# Patient Record
Sex: Female | Born: 1935 | Race: White | Hispanic: No | Smoking: Never smoker
Health system: Southern US, Community
[De-identification: ages and names within clinical notes are randomized; demographics above are authoritative.]

## PROBLEM LIST (undated history)

## (undated) DIAGNOSIS — D332 Benign neoplasm of brain, unspecified: Secondary | ICD-10-CM

## (undated) DIAGNOSIS — R51 Headache: Secondary | ICD-10-CM

## (undated) DIAGNOSIS — R011 Cardiac murmur, unspecified: Secondary | ICD-10-CM

## (undated) DIAGNOSIS — G20C Parkinsonism, unspecified: Secondary | ICD-10-CM

## (undated) DIAGNOSIS — M1990 Unspecified osteoarthritis, unspecified site: Secondary | ICD-10-CM

## (undated) DIAGNOSIS — R569 Unspecified convulsions: Secondary | ICD-10-CM

## (undated) DIAGNOSIS — F419 Anxiety disorder, unspecified: Secondary | ICD-10-CM

## (undated) DIAGNOSIS — R0602 Shortness of breath: Secondary | ICD-10-CM

## (undated) DIAGNOSIS — H269 Unspecified cataract: Secondary | ICD-10-CM

## (undated) DIAGNOSIS — R413 Other amnesia: Secondary | ICD-10-CM

## (undated) DIAGNOSIS — I499 Cardiac arrhythmia, unspecified: Secondary | ICD-10-CM

## (undated) DIAGNOSIS — I10 Essential (primary) hypertension: Secondary | ICD-10-CM

## (undated) DIAGNOSIS — K219 Gastro-esophageal reflux disease without esophagitis: Secondary | ICD-10-CM

## (undated) DIAGNOSIS — G20 Parkinson's disease: Secondary | ICD-10-CM

## (undated) DIAGNOSIS — E7800 Pure hypercholesterolemia, unspecified: Secondary | ICD-10-CM

## (undated) DIAGNOSIS — Z8719 Personal history of other diseases of the digestive system: Secondary | ICD-10-CM

## (undated) HISTORY — DX: Parkinson's disease: G20

## (undated) HISTORY — DX: Parkinsonism, unspecified: G20.C

## (undated) HISTORY — DX: Cardiac murmur, unspecified: R01.1

## (undated) HISTORY — PX: BUNIONECTOMY: SHX129

## (undated) HISTORY — PX: BREAST EXCISIONAL BIOPSY: SUR124

## (undated) HISTORY — PX: EYE SURGERY: SHX253

## (undated) HISTORY — DX: Pure hypercholesterolemia, unspecified: E78.00

## (undated) HISTORY — PX: ABDOMINAL HYSTERECTOMY: SHX81

## (undated) HISTORY — DX: Unspecified cataract: H26.9

## (undated) HISTORY — DX: Other amnesia: R41.3

---

## 1999-05-01 ENCOUNTER — Ambulatory Visit (HOSPITAL_COMMUNITY): Admission: RE | Admit: 1999-05-01 | Discharge: 1999-05-01 | Payer: Self-pay | Admitting: Internal Medicine

## 1999-05-01 ENCOUNTER — Encounter (INDEPENDENT_AMBULATORY_CARE_PROVIDER_SITE_OTHER): Payer: Self-pay | Admitting: *Deleted

## 1999-05-30 ENCOUNTER — Encounter: Payer: Self-pay | Admitting: Obstetrics and Gynecology

## 1999-06-03 ENCOUNTER — Encounter (INDEPENDENT_AMBULATORY_CARE_PROVIDER_SITE_OTHER): Payer: Self-pay

## 1999-06-03 ENCOUNTER — Inpatient Hospital Stay (HOSPITAL_COMMUNITY): Admission: RE | Admit: 1999-06-03 | Discharge: 1999-06-06 | Payer: Self-pay | Admitting: Obstetrics and Gynecology

## 1999-06-27 ENCOUNTER — Encounter: Admission: RE | Admit: 1999-06-27 | Discharge: 1999-06-27 | Payer: Self-pay | Admitting: Internal Medicine

## 1999-06-27 ENCOUNTER — Encounter: Payer: Self-pay | Admitting: Internal Medicine

## 1999-12-19 ENCOUNTER — Encounter: Admission: RE | Admit: 1999-12-19 | Discharge: 1999-12-19 | Payer: Self-pay | Admitting: Internal Medicine

## 1999-12-19 ENCOUNTER — Encounter: Payer: Self-pay | Admitting: Internal Medicine

## 2000-12-10 ENCOUNTER — Encounter: Payer: Self-pay | Admitting: Internal Medicine

## 2000-12-10 ENCOUNTER — Encounter: Admission: RE | Admit: 2000-12-10 | Discharge: 2000-12-10 | Payer: Self-pay | Admitting: Internal Medicine

## 2001-10-10 ENCOUNTER — Encounter: Admission: RE | Admit: 2001-10-10 | Discharge: 2001-10-10 | Payer: Self-pay | Admitting: Internal Medicine

## 2001-10-10 ENCOUNTER — Encounter: Payer: Self-pay | Admitting: Internal Medicine

## 2001-12-14 ENCOUNTER — Encounter: Admission: RE | Admit: 2001-12-14 | Discharge: 2001-12-14 | Payer: Self-pay | Admitting: Internal Medicine

## 2001-12-14 ENCOUNTER — Encounter: Payer: Self-pay | Admitting: Internal Medicine

## 2002-11-10 ENCOUNTER — Ambulatory Visit (HOSPITAL_COMMUNITY): Admission: RE | Admit: 2002-11-10 | Discharge: 2002-11-10 | Payer: Self-pay | Admitting: Gastroenterology

## 2002-12-19 ENCOUNTER — Encounter: Admission: RE | Admit: 2002-12-19 | Discharge: 2002-12-19 | Payer: Self-pay | Admitting: Internal Medicine

## 2002-12-19 ENCOUNTER — Encounter: Payer: Self-pay | Admitting: Internal Medicine

## 2003-02-26 ENCOUNTER — Ambulatory Visit (HOSPITAL_COMMUNITY): Admission: RE | Admit: 2003-02-26 | Discharge: 2003-02-26 | Payer: Self-pay | Admitting: General Surgery

## 2003-02-26 ENCOUNTER — Encounter (INDEPENDENT_AMBULATORY_CARE_PROVIDER_SITE_OTHER): Payer: Self-pay

## 2003-12-20 ENCOUNTER — Encounter: Admission: RE | Admit: 2003-12-20 | Discharge: 2003-12-20 | Payer: Self-pay | Admitting: Internal Medicine

## 2004-04-20 HISTORY — PX: KNEE SURGERY: SHX244

## 2004-04-30 ENCOUNTER — Encounter: Admission: RE | Admit: 2004-04-30 | Discharge: 2004-04-30 | Payer: Self-pay | Admitting: Orthopedic Surgery

## 2005-01-19 ENCOUNTER — Encounter: Admission: RE | Admit: 2005-01-19 | Discharge: 2005-01-19 | Payer: Self-pay | Admitting: Internal Medicine

## 2006-01-26 ENCOUNTER — Encounter: Admission: RE | Admit: 2006-01-26 | Discharge: 2006-01-26 | Payer: Self-pay | Admitting: Internal Medicine

## 2006-12-08 ENCOUNTER — Ambulatory Visit (HOSPITAL_COMMUNITY): Admission: RE | Admit: 2006-12-08 | Discharge: 2006-12-09 | Payer: Self-pay | Admitting: Orthopedic Surgery

## 2007-02-18 ENCOUNTER — Encounter: Admission: RE | Admit: 2007-02-18 | Discharge: 2007-02-18 | Payer: Self-pay | Admitting: Internal Medicine

## 2007-04-18 ENCOUNTER — Encounter: Admission: RE | Admit: 2007-04-18 | Discharge: 2007-04-18 | Payer: Self-pay | Admitting: Internal Medicine

## 2007-05-06 ENCOUNTER — Encounter: Admission: RE | Admit: 2007-05-06 | Discharge: 2007-05-06 | Payer: Self-pay | Admitting: Internal Medicine

## 2007-05-13 ENCOUNTER — Encounter: Admission: RE | Admit: 2007-05-13 | Discharge: 2007-05-13 | Payer: Self-pay | Admitting: Internal Medicine

## 2008-03-12 ENCOUNTER — Encounter: Admission: RE | Admit: 2008-03-12 | Discharge: 2008-03-12 | Payer: Self-pay | Admitting: Internal Medicine

## 2008-04-20 DIAGNOSIS — D332 Benign neoplasm of brain, unspecified: Secondary | ICD-10-CM

## 2008-04-20 HISTORY — DX: Benign neoplasm of brain, unspecified: D33.2

## 2008-04-20 HISTORY — PX: BRAIN BIOPSY: SHX905

## 2008-06-07 ENCOUNTER — Encounter: Admission: RE | Admit: 2008-06-07 | Discharge: 2008-06-07 | Payer: Self-pay | Admitting: Neurology

## 2008-10-12 ENCOUNTER — Encounter: Admission: RE | Admit: 2008-10-12 | Discharge: 2008-10-12 | Payer: Self-pay | Admitting: Internal Medicine

## 2009-03-04 ENCOUNTER — Encounter: Admission: RE | Admit: 2009-03-04 | Discharge: 2009-03-04 | Payer: Self-pay | Admitting: Neurology

## 2009-06-14 DIAGNOSIS — M810 Age-related osteoporosis without current pathological fracture: Secondary | ICD-10-CM | POA: Insufficient documentation

## 2009-06-14 DIAGNOSIS — F3289 Other specified depressive episodes: Secondary | ICD-10-CM | POA: Insufficient documentation

## 2010-04-30 ENCOUNTER — Encounter
Admission: RE | Admit: 2010-04-30 | Discharge: 2010-04-30 | Payer: Self-pay | Source: Home / Self Care | Attending: Internal Medicine | Admitting: Internal Medicine

## 2010-05-11 ENCOUNTER — Encounter: Payer: Self-pay | Admitting: Internal Medicine

## 2010-06-25 ENCOUNTER — Ambulatory Visit
Admission: RE | Admit: 2010-06-25 | Discharge: 2010-06-25 | Disposition: A | Discharge status: Home / Self Care | Payer: Self-pay | Source: Ambulatory Visit | Attending: Internal Medicine | Admitting: Internal Medicine

## 2010-06-25 ENCOUNTER — Other Ambulatory Visit: Payer: Self-pay | Admitting: Internal Medicine

## 2010-06-25 DIAGNOSIS — M25519 Pain in unspecified shoulder: Secondary | ICD-10-CM

## 2010-07-17 ENCOUNTER — Emergency Department (HOSPITAL_COMMUNITY): Payer: Medicare Other

## 2010-07-17 ENCOUNTER — Emergency Department (HOSPITAL_COMMUNITY)
Admission: EM | Admit: 2010-07-17 | Discharge: 2010-07-18 | Disposition: A | Discharge status: Home / Self Care | Payer: Medicare Other | Attending: Emergency Medicine | Admitting: Emergency Medicine

## 2010-07-17 DIAGNOSIS — I499 Cardiac arrhythmia, unspecified: Secondary | ICD-10-CM | POA: Insufficient documentation

## 2010-07-17 DIAGNOSIS — N390 Urinary tract infection, site not specified: Secondary | ICD-10-CM | POA: Insufficient documentation

## 2010-07-17 DIAGNOSIS — R51 Headache: Secondary | ICD-10-CM | POA: Insufficient documentation

## 2010-07-17 DIAGNOSIS — G40909 Epilepsy, unspecified, not intractable, without status epilepticus: Secondary | ICD-10-CM | POA: Insufficient documentation

## 2010-07-17 DIAGNOSIS — R42 Dizziness and giddiness: Secondary | ICD-10-CM | POA: Insufficient documentation

## 2010-07-17 DIAGNOSIS — M1990 Unspecified osteoarthritis, unspecified site: Secondary | ICD-10-CM | POA: Insufficient documentation

## 2010-07-17 LAB — CBC
HCT: 41.9 % (ref 36.0–46.0)
Hemoglobin: 14.1 g/dL (ref 12.0–15.0)
MCH: 31.9 pg (ref 26.0–34.0)
MCHC: 33.7 g/dL (ref 30.0–36.0)
MCV: 94.8 fL (ref 78.0–100.0)
Platelets: 156 K/uL (ref 150–400)
RBC: 4.42 MIL/uL (ref 3.87–5.11)
RDW: 13.2 % (ref 11.5–15.5)
WBC: 15.7 K/uL — ABNORMAL HIGH (ref 4.0–10.5)

## 2010-07-17 LAB — DIFFERENTIAL
Basophils Absolute: 0 K/uL (ref 0.0–0.1)
Basophils Relative: 0 % (ref 0–1)
Eosinophils Absolute: 0 K/uL (ref 0.0–0.7)
Eosinophils Relative: 0 % (ref 0–5)
Lymphocytes Relative: 4 % — ABNORMAL LOW (ref 12–46)
Lymphs Abs: 0.6 10*3/uL — ABNORMAL LOW (ref 0.7–4.0)
Monocytes Absolute: 1.3 10*3/uL — ABNORMAL HIGH (ref 0.1–1.0)
Monocytes Relative: 9 % (ref 3–12)
Neutro Abs: 13.8 10*3/uL — ABNORMAL HIGH (ref 1.7–7.7)
Neutrophils Relative %: 88 % — ABNORMAL HIGH (ref 43–77)

## 2010-07-17 LAB — URINE MICROSCOPIC-ADD ON

## 2010-07-17 LAB — URINALYSIS, ROUTINE W REFLEX MICROSCOPIC
Bilirubin Urine: NEGATIVE
Glucose, UA: NEGATIVE mg/dL
Ketones, ur: NEGATIVE mg/dL
Nitrite: POSITIVE — AB
Protein, ur: 30 mg/dL — AB
Specific Gravity, Urine: 1.008 (ref 1.005–1.030)
Urobilinogen, UA: 0.2 mg/dL (ref 0.0–1.0)
pH: 7 (ref 5.0–8.0)

## 2010-07-17 LAB — POCT I-STAT, CHEM 8
BUN: 11 mg/dL (ref 6–23)
Creatinine, Ser: 0.7 mg/dL (ref 0.4–1.2)
Glucose, Bld: 137 mg/dL — ABNORMAL HIGH (ref 70–99)
Potassium: 4.1 mEq/L (ref 3.5–5.1)
Sodium: 137 mEq/L (ref 135–145)

## 2010-07-17 LAB — POCT CARDIAC MARKERS: Myoglobin, poc: 60.8 ng/mL (ref 12–200)

## 2010-07-19 LAB — URINE CULTURE
Colony Count: >=100000
Culture  Setup Time: 201203300358

## 2010-09-02 NOTE — Op Note (Signed)
NAMEKRISTINE, Carrie Short                  ACCOUNT NO.:  [id number]   MEDICAL RECORD NO.:  [id number]          PATIENT TYPE:  AMB   LOCATION:  DAY                          FACILITY:  [facility]   PHYSICIAN:  Marlowe Kays, M.D.  DATE OF BIRTH:  07-14-1927   DATE OF PROCEDURE:  12/08/2006  DATE OF DISCHARGE:                               OPERATIVE REPORT   PREOPERATIVE DIAGNOSES:  1. Osteoarthritis.  2. Torn medial meniscus.  3. Loose body in anterior central joint, left knee.   POSTOPERATIVE DIAGNOSES:  1. Osteoarthritis.  2. Torn medial meniscus.  3. Loose body in anterior central joint, left knee.  4. Torn lateral meniscus.  5. Medial shelf plica.   OPERATION:  1. Left knee arthroscopy with one partial medial lateral meniscectomy.  2. Removal of large loose body anterior central knee.  3. Shaving medial femoral condyle.  4. Excision medial shelf plica.   SURGEON:  Marlowe Kays, M.D.   ASSISTANT:  Nurse.   ANESTHESIA:  General.   PATHOLOGY AND JUSTIFICATION FOR PROCEDURE:  Because of painful left knee  with difficulty, walking because it was unstable, I obtained an MRI  which demonstrated the preoperative diagnoses.  Accordingly she is here  today for the above-mentioned surgery.   PROCEDURE:  Satisfactory general anesthesia, time-out performed, an Ace  wrap and knee support to right lower extremity.  Pneumatic tourniquet  with the leg Esmarched out non sterilely to the left lower extremity,  thigh stabilizer applied.  The left leg was prepped with DuraPrep from  stabilizer to ankle, draped in sterile field.  Superior medial saline  inflow, first through an anterolateral portal, medial compartment joint  was evaluated.  Immediately apparent was a large loose fragment which  was wrapped into the medial portion of the ACL with a portion of the ACL  disrupted.  Was also was a little bit of surrounding synovitis.  With  the 3.5 shaver.  I resected most of reactive tissue which  exposed the  loose body and I was able to bring it out piecemeal with pituitary  rongeur.  As part of this I had to sacrifice some of the medial portion  of the ACL which was significantly disrupted.  Her medial femoral  condyle had a good bit of wear.  I would  rate it grade 3 out of 4 and I  shaved this down until smooth.  The entire posterior third of the medial  meniscus was torn.  Representative pictures were taken.  I then trimmed  this back to stable rim with a combination of baskets and a 3.5 shaver.  The final pictures were taken for the stable remnant.  Looking at the  medial gutter and suprapatellar area, she had a medial shelf plica which  I sectioned with scissors and evacuated with the 3.5 shaver.  Her  patella had wear but nothing shavable.  I then reversed portals.  She  had a large bucket-handle tear of the anterior half off the lateral  meniscus which I sectioned with scissors anteriorly and posteriorly and  then removed the torn portion  of lateral meniscus which actually  encompassed almost the entire lateral meniscus at the anterior half.  Final pictures were taken.  Knee joint was irrigated until clear and all  fluid possible, removed.  The two anterior portals closed with 4-0  nylon.  I injected 20 mL 0.5% Marcaine with adrenalin and 4 mg of  morphine through the inflow apparatus which was removed and this portal  closed with 4-0 nylon as well.  Betadine, Adaptic dry sterile dressing  were applied.  Tourniquet was released.  She tolerated the procedure  well, was taken recovery satisfactory condition with no known  complications.           ______________________________  Marlowe Kays, M.D.     JA/MEDQ  D:  12/08/2006  T:  12/09/2006  Job:  [id number]

## 2010-09-05 NOTE — Op Note (Signed)
   NAME:  Carrie Short, Carrie Short                            ACCOUNT NO.:  [id number]   MEDICAL RECORD NO.:  [id number]                   PATIENT TYPE:  AMB   LOCATION:  ENDO                                 FACILITY:  [facility]   PHYSICIAN:  Graylin Shiver, M.D.                DATE OF BIRTH:  12-31-1927   DATE OF PROCEDURE:  11/10/2002  DATE OF DISCHARGE:                                 OPERATIVE REPORT   PROCEDURE:  Colonoscopy.   INDICATION FOR PROCEDURE:  Rectal bleeding, family history of colon cancer.   INFORMED CONSENT:  Informed consent was obtained after explanation of the  risks of bleeding, infection, and perforation.   PREMEDICATIONS:  The procedure was done immediately after an EGD.  An  additional 12.5 mcg of fentanyl were given as well as an additional 1 mg of  Versed.   DESCRIPTION OF PROCEDURE:  After informed consent had been obtained after  explanation of the risks of bleeding, infection, and perforation and after  sedation was achieved with the above-mentioned medications, a rectal exam  was performed.  No external lesions were seen.  There was felt on digital  rectal exam to be a nodule in the anal area and just inside the rectum on  the anterior side.  The Olympus colonoscope was inserted into the rectum and  advanced around the colon to the cecum.  Cecal landmarks were identified.  The cecum and ascending colon were normal.  The transverse colon was normal.  The descending colon, sigmoid, and rectum looked normal.  The scope was  retroflexed in the rectum, and a 1 cm anal polyp could be seen.  This  appeared to have skin on the mucosa and was seen on retroflexion.  The scope  was then straightened and brought out, and the polyp in the anal area could  be seen as the scope was brought out.  She tolerated this procedure well  without complications.   IMPRESSION:  Normal colonic mucosa from rectum to cecum with presence of a 1  cm anal polyp which protruded into the rectum  and was seen on retroflexion.   PLAN:  I will make a surgical referral for further evaluation and  consideration of excision of this large anal polyp.                                               Graylin Shiver, M.D.    Carrie Short  D:  11/10/2002  T:  11/10/2002  Job:  [id number]   cc:   Antony Madura, M.D.  [address]  [place]  [place] [address]  Fax: [phone]

## 2010-09-05 NOTE — Op Note (Signed)
   NAME:  Carrie Short, Carrie Short                            ACCOUNT NO.:  [id number]   MEDICAL RECORD NO.:  [id number]                   PATIENT TYPE:  AMB   LOCATION:  ENDO                                 FACILITY:  [facility]   PHYSICIAN:  Graylin Shiver, M.D.                DATE OF BIRTH:  [birth year]   DATE OF PROCEDURE:  11/10/2002  DATE OF DISCHARGE:                                 OPERATIVE REPORT   PROCEDURE:  Esophagogastroduodenoscopy.   INDICATION FOR PROCEDURE:  Chronic heartburn and indigestion.   INFORMED CONSENT:  Informed consent was obtained after explanation of the  risks of bleeding, infection, and perforation.   PREMEDICATIONS:  1. Fentanyl 25 mcg IV.  2. Versed 3 mg IV.   DESCRIPTION OF PROCEDURE:  With the patient in the left lateral decubitus  position, the Olympus gastroscope was inserted into the oropharynx and  passed into the esophagus.  It was advanced down the esophagus, then into  the stomach and into the duodenum.  The second portion and bulb of the  duodenum were normal.  The stomach had normal-appearing mucosa.  There was a  moderate-sized hiatal hernia.  No abnormalities were seen in the fundus or  cardia upon retroflexion other than the hiatal hernia.  The esophagus  revealed a normal esophagogastric junction.  It was located at 35 cm.  The  esophageal mucosa looked normal.  She tolerated the procedure well without  complications.   IMPRESSION:  1. Hiatal hernia.  2. Normal-appearing mucosa on EGD.   COMMENTS:  I suspect that the patient's heartburn is secondary to  gastroesophageal reflux related to her hiatal hernia, and she would probably  benefit from the use of a proton pump inhibitor or H2 blocker.  She has  taken Prilosec in the past which has helped.  I will instruct her to get  over-the-counter Prilosec to try and control her symptoms of heartburn.                                               Graylin Shiver, M.D.    Germain Osgood  D:  11/10/2002   T:  11/10/2002  Job:  [id number]   cc:   Antony Madura, M.D.  [address]  [place]  [place] [address]  Fax: [phone]

## 2010-09-05 NOTE — Op Note (Signed)
   Carrie Short, Carrie Short                              ACCOUNT NO.:  [id number]   MEDICAL RECORD NO.:  [id number]                   PATIENT TYPE:   LOCATION:                                       FACILITY:  [facility]   PHYSICIAN:  Timothy E. Earlene Plater, M.D.              DATE OF BIRTH:   DATE OF PROCEDURE:  02/26/2003  DATE OF DISCHARGE:                                 OPERATIVE REPORT   PREOPERATIVE DIAGNOSIS:  Anal polyp.   POSTOPERATIVE DIAGNOSIS:  Anal polyp.   PROCEDURE:  Removal of hemorrhoid and anal polyp.   SURGEON:  Timothy E. Earlene Plater, M.D.   ANESTHESIA:  Local standby.   Ms. Kanzler recently underwent upper and lower endoscopy by Graylin Shiver,  M.D.  Though colonoscopy was normal, she did have a large prolapsing anal  polyp.  After careful discussion in the office, she wishes to proceed with  excision.  She was prepared at home, identified, the permit signed,  evaluated by anesthesia.   She was taken to the operating room and placed supine, IV antibiotics given.  She was gently placed into the lithotomy position, the perianal area  inspected, prepped and draped.  The left anterior polyp easily prolapsed on  its own.  The area was injected with Marcaine 4% with epinephrine and  massaged in well.  The polyp was left anterior.  It was attached to a  generous bundle of hemorrhoidal varicosities.  The entire bundle with the  attached polyp was prolapsed and then completely excised with undermining of  the edges and careful avoidance of the sphincters.  The resulting wound was  then closed with a running 2-0 chromic with good control of bleeding and  closure of the  mucosal defect.  She tolerated it well.  There were no complications.  Gelfoam and a dry dressing applied.  Counts correct.  She tolerated it well  and was removed to the recovery room in good condition.   Written and verbal instructions were given to her and her family, and she  will be followed as an outpatient.                                           Timothy E. Earlene Plater, M.D.    TED/MEDQ  D:  02/26/2003  T:  02/26/2003  Job:  [id number]   cc:   Graylin Shiver, M.D.  [address]  [place], [place] [address]  Fax: [phone]   Antony Madura, M.D.  [address]  [place]  [place] [address]  Fax: [phone]

## 2010-11-22 DIAGNOSIS — K219 Gastro-esophageal reflux disease without esophagitis: Secondary | ICD-10-CM | POA: Insufficient documentation

## 2011-01-30 LAB — APTT: aPTT: 30

## 2011-01-30 LAB — BASIC METABOLIC PANEL
CO2: 29
Chloride: 110
GFR calc Af Amer: >60
Potassium: 5.1
Sodium: 144

## 2011-01-30 LAB — PROTIME-INR: Prothrombin Time: 13.3

## 2011-01-30 LAB — HEMOGLOBIN AND HEMATOCRIT, BLOOD
HCT: 37
Hemoglobin: 12.7

## 2011-05-20 ENCOUNTER — Other Ambulatory Visit: Payer: Self-pay | Admitting: Internal Medicine

## 2011-05-20 DIAGNOSIS — Z1231 Encounter for screening mammogram for malignant neoplasm of breast: Secondary | ICD-10-CM

## 2011-06-03 ENCOUNTER — Ambulatory Visit
Admission: RE | Admit: 2011-06-03 | Discharge: 2011-06-03 | Disposition: A | Discharge status: Home / Self Care | Payer: Medicare Other | Source: Ambulatory Visit | Attending: Internal Medicine | Admitting: Internal Medicine

## 2011-06-03 DIAGNOSIS — Z1231 Encounter for screening mammogram for malignant neoplasm of breast: Secondary | ICD-10-CM

## 2011-07-22 ENCOUNTER — Ambulatory Visit
Admission: RE | Admit: 2011-07-22 | Discharge: 2011-07-22 | Disposition: A | Discharge status: Home / Self Care | Payer: Medicare Other | Source: Ambulatory Visit | Attending: Internal Medicine | Admitting: Internal Medicine

## 2011-07-22 ENCOUNTER — Other Ambulatory Visit: Payer: Self-pay | Admitting: Internal Medicine

## 2011-07-22 DIAGNOSIS — R0602 Shortness of breath: Secondary | ICD-10-CM

## 2011-07-22 DIAGNOSIS — R05 Cough: Secondary | ICD-10-CM

## 2012-04-06 ENCOUNTER — Other Ambulatory Visit: Payer: Self-pay | Admitting: Internal Medicine

## 2012-04-06 DIAGNOSIS — K30 Functional dyspepsia: Secondary | ICD-10-CM

## 2012-04-18 ENCOUNTER — Ambulatory Visit
Admission: RE | Admit: 2012-04-18 | Discharge: 2012-04-18 | Disposition: A | Discharge status: Home / Self Care | Payer: Medicare Other | Source: Ambulatory Visit | Attending: Internal Medicine | Admitting: Internal Medicine

## 2012-04-18 DIAGNOSIS — K30 Functional dyspepsia: Secondary | ICD-10-CM

## 2012-06-22 ENCOUNTER — Other Ambulatory Visit: Payer: Self-pay | Admitting: Neurology

## 2012-06-29 ENCOUNTER — Ambulatory Visit
Admission: RE | Admit: 2012-06-29 | Discharge: 2012-06-29 | Disposition: A | Discharge status: Home / Self Care | Payer: Medicare Other | Source: Ambulatory Visit | Attending: Neurology | Admitting: Neurology

## 2012-06-29 DIAGNOSIS — R9409 Abnormal results of other function studies of central nervous system: Secondary | ICD-10-CM

## 2012-06-29 MED ORDER — GADOBENATE DIMEGLUMINE 529 MG/ML IV SOLN
15.0000 mL | Freq: Once | INTRAVENOUS | Status: AC | PRN
  Administered 2012-06-29: 15 mL via INTRAVENOUS

## 2012-08-02 ENCOUNTER — Telehealth: Payer: Self-pay

## 2012-08-02 MED ORDER — VENLAFAXINE HCL ER 75 MG PO CP24: 75.0000 mg | ORAL_CAPSULE | Freq: Every day | ORAL | Status: DC

## 2012-08-02 NOTE — Telephone Encounter (Signed)
[facility] patient calling to get refills on Effexor.  Approved through [facility] this morning, Dr Frances Furbish.

## 2012-09-27 ENCOUNTER — Other Ambulatory Visit: Payer: Self-pay

## 2012-10-06 ENCOUNTER — Telehealth: Payer: Self-pay | Admitting: Diagnostic Neuroimaging

## 2012-10-06 NOTE — Telephone Encounter (Signed)
Calling to cancel/reschedule patient - 12/26/2012

## 2012-10-12 ENCOUNTER — Other Ambulatory Visit: Payer: Self-pay | Admitting: Internal Medicine

## 2012-10-12 DIAGNOSIS — N6452 Nipple discharge: Secondary | ICD-10-CM

## 2012-10-19 ENCOUNTER — Other Ambulatory Visit: Payer: Self-pay

## 2012-10-19 MED ORDER — ROPINIROLE HCL 0.5 MG PO TABS: 0.5000 mg | ORAL_TABLET | Freq: Every day | ORAL | Status: DC

## 2012-10-19 NOTE — Telephone Encounter (Signed)
[facility] patient assigned to Dr Marjory Lies.  Has appt in Sept.  Pharmacy is requesting 90 day Rx.

## 2012-10-20 ENCOUNTER — Ambulatory Visit
Admission: RE | Admit: 2012-10-20 | Discharge: 2012-10-20 | Disposition: A | Discharge status: Home / Self Care | Payer: Medicare Other | Source: Ambulatory Visit | Attending: Internal Medicine | Admitting: Internal Medicine

## 2012-10-20 ENCOUNTER — Other Ambulatory Visit: Payer: Self-pay | Admitting: Internal Medicine

## 2012-10-20 ENCOUNTER — Telehealth: Payer: Self-pay | Admitting: Diagnostic Neuroimaging

## 2012-10-20 DIAGNOSIS — N6310 Unspecified lump in the right breast, unspecified quadrant: Secondary | ICD-10-CM

## 2012-10-20 DIAGNOSIS — N6452 Nipple discharge: Secondary | ICD-10-CM

## 2012-10-24 ENCOUNTER — Telehealth: Payer: Self-pay | Admitting: *Deleted

## 2012-10-24 NOTE — Telephone Encounter (Signed)
Pt called and stated someone had called her regarding her appt on 12/26/12, but according to schedule patients appointment was cancelled with Dr. Marjory Lies on 12/26/12

## 2012-11-03 ENCOUNTER — Other Ambulatory Visit: Payer: Self-pay | Admitting: Internal Medicine

## 2012-11-03 ENCOUNTER — Ambulatory Visit
Admission: RE | Admit: 2012-11-03 | Discharge: 2012-11-03 | Disposition: A | Discharge status: Home / Self Care | Payer: Medicare Other | Source: Ambulatory Visit | Attending: Internal Medicine | Admitting: Internal Medicine

## 2012-11-03 DIAGNOSIS — N6310 Unspecified lump in the right breast, unspecified quadrant: Secondary | ICD-10-CM

## 2012-11-18 ENCOUNTER — Encounter (INDEPENDENT_AMBULATORY_CARE_PROVIDER_SITE_OTHER): Payer: Self-pay | Admitting: General Surgery

## 2012-11-18 ENCOUNTER — Ambulatory Visit (INDEPENDENT_AMBULATORY_CARE_PROVIDER_SITE_OTHER): Payer: Medicare Other | Admitting: General Surgery

## 2012-11-18 VITALS — BP 130/72 | HR 74 | Temp 98.0°F | Resp 18 | Ht 63.0 in | Wt 167.0 lb

## 2012-11-18 DIAGNOSIS — N63 Unspecified lump in unspecified breast: Secondary | ICD-10-CM

## 2012-11-18 DIAGNOSIS — N6310 Unspecified lump in the right breast, unspecified quadrant: Secondary | ICD-10-CM

## 2012-11-18 NOTE — Progress Notes (Signed)
Patient ID: Carrie Short, female   DOB: 08/23/1927, 79 y.o.   MRN: [id number]  Chief Complaint  Patient presents with  . New Evaluation    rt breast     HPI Carrie Short is a 79 y.o. female.  Referred by Dr Roberts HPI 84 yof with a couple of episodes of right nipple discharge one of which appears to be spontaneous and the other expressed.  She reports no other complaints referable to her breasts.  She underwent mm that shows no real abnormality.  An us shows an intraductal mass at 2 o'clock 2 cm from the right nipple measuring 4x8x3 mm.  Core biopsy was obtained that showed fragments of intraductal papilloma. She comes in today with no more discharge to discuss her options.  No past medical history on file. History of brain biopsy now being followed by mri benign, htn  Past Surgical History  Procedure Laterality Date  . Abdominal hysterectomy  1990s  . Brain biopsy  2010  . Bunionectomy      left foot    Family History  Problem Relation Age of Onset  . Heart disease Mother   . Cancer Daughter     breast mets to lung  . Diabetes Son     Social History History  Substance Use Topics  . Smoking status: Never Smoker   . Smokeless tobacco: Not on file  . Alcohol Use: Not on file    No Known Allergies  Current Outpatient Prescriptions  Medication Sig Dispense Refill  . celecoxib (CELEBREX) 200 MG capsule Take 200 mg by mouth 2 (two) times daily.      . diazepam (VALIUM) 2 MG tablet Take 2 mg by mouth every 6 (six) hours as needed for anxiety.      . lamoTRIgine (LAMICTAL) 100 MG tablet       . LORazepam (ATIVAN) 1 MG tablet Take 1 mg by mouth every 8 (eight) hours.      . nadolol (CORGARD) 20 MG tablet       . Pseudoeph-Doxylamine-DM-APAP (NYQUIL PO) Take by mouth.      . rOPINIRole (REQUIP) 0.5 MG tablet Take 1 tablet (0.5 mg total) by mouth at bedtime.  90 tablet  0  . venlafaxine XR (EFFEXOR-XR) 75 MG 24 hr capsule Take 1 capsule (75 mg total) by mouth daily.  90 capsule   3  . predniSONE (DELTASONE) 10 MG tablet        No current facility-administered medications for this visit.    Review of Systems Review of Systems  Constitutional: Negative for fever, chills and unexpected weight change.  HENT: Negative for hearing loss, congestion, sore throat, trouble swallowing and voice change.   Eyes: Negative for visual disturbance.  Respiratory: Negative for cough and wheezing.   Cardiovascular: Positive for palpitations. Negative for chest pain and leg swelling.  Gastrointestinal: Negative for nausea, vomiting, abdominal pain, diarrhea, constipation, blood in stool, abdominal distention and anal bleeding.  Genitourinary: Negative for hematuria, vaginal bleeding and difficulty urinating.  Musculoskeletal: Positive for arthralgias.  Skin: Negative for rash and wound.  Neurological: Negative for seizures, syncope and headaches.  Hematological: Negative for adenopathy. Does not bruise/bleed easily.  Psychiatric/Behavioral: Positive for confusion.    Blood pressure 130/72, pulse 74, temperature 98 F (36.7 C), resp. rate 18, height 5' 3" (1.6 m), weight 167 lb (75.751 kg).  Physical Exam Physical Exam  Vitals reviewed. Constitutional: She appears well-developed and well-nourished.  Eyes: No scleral icterus.  Neck: Neck   supple.  Cardiovascular: Normal rate, regular rhythm and normal heart sounds.   Pulmonary/Chest: Effort normal and breath sounds normal. She has no wheezes. She has no rales. Right breast exhibits no inverted nipple, no mass, no nipple discharge, no skin change and no tenderness. Left breast exhibits no inverted nipple, no mass, no nipple discharge, no skin change and no tenderness.  Abdominal: Soft. There is no tenderness.  Lymphadenopathy:    She has no cervical adenopathy.    She has no axillary adenopathy.       Right: No supraclavicular adenopathy present.       Left: No supraclavicular adenopathy present.    Data Reviewed DIGITAL  DIAGNOSTIC BILATERAL MAMMOGRAM WITH CAD AND RIGHT BREAST  ULTRASOUND:  Comparison: 06/03/2011, 04/30/2010, 03/12/2008, 02/18/2007  Findings:  ACR Breast Density Category b: There are scattered areas of  fibroglandular density.  There are multiple nonspecific nodules scattered throughout both  breasts. No suspicious calcifications are noted.  Mammographic images were processed with CAD.  On physical exam, I visualize a tiny amount of fluid emanating from  a duct in the upper inner quadrant of the right nipple.  Ultrasound is performed, showing an intraductal mass at 2 o'clock 2  cm from the right nipple measuring 4 x 8 6 x 3 mm. A simple cyst  is noted at 9 o'clock 2 cm from the right nipple.IMPRESSION:  Intraductal mass at 2 o'clock 2 cm from the right nipple.  RECOMMENDATION:  Ultrasound-guided core needle biopsy is suggested. This has been  scheduled for 11/03/2012 at 1:00 p.m.    Assessment    Right breast mass with nipple discharge     Plan    Right breast wire localized excision  We discussed options including observation vs wire guided excision.  We will plan on removing this mass and discussed performance, risks and benefits with that.          Armand Preast 11/18/2012, 11:02 AM    

## 2012-11-22 ENCOUNTER — Encounter (INDEPENDENT_AMBULATORY_CARE_PROVIDER_SITE_OTHER): Payer: Self-pay | Admitting: General Surgery

## 2012-11-30 ENCOUNTER — Encounter (HOSPITAL_COMMUNITY): Payer: Self-pay | Admitting: Pharmacy Technician

## 2012-12-05 ENCOUNTER — Encounter (HOSPITAL_COMMUNITY)
Admission: RE | Admit: 2012-12-05 | Discharge: 2012-12-05 | Disposition: A | Discharge status: Home / Self Care | Payer: Medicare Other | Source: Ambulatory Visit | Attending: General Surgery | Admitting: General Surgery

## 2012-12-05 ENCOUNTER — Ambulatory Visit (INDEPENDENT_AMBULATORY_CARE_PROVIDER_SITE_OTHER): Payer: Self-pay | Admitting: General Surgery

## 2012-12-05 ENCOUNTER — Ambulatory Visit (HOSPITAL_COMMUNITY)
Admission: RE | Admit: 2012-12-05 | Discharge: 2012-12-05 | Disposition: A | Discharge status: Home / Self Care | Payer: Medicare Other | Source: Ambulatory Visit | Attending: General Surgery | Admitting: General Surgery

## 2012-12-05 ENCOUNTER — Encounter (HOSPITAL_COMMUNITY): Payer: Self-pay

## 2012-12-05 DIAGNOSIS — Z01818 Encounter for other preprocedural examination: Secondary | ICD-10-CM | POA: Insufficient documentation

## 2012-12-05 DIAGNOSIS — Z01812 Encounter for preprocedural laboratory examination: Secondary | ICD-10-CM | POA: Insufficient documentation

## 2012-12-05 DIAGNOSIS — Z01810 Encounter for preprocedural cardiovascular examination: Secondary | ICD-10-CM | POA: Insufficient documentation

## 2012-12-05 DIAGNOSIS — I498 Other specified cardiac arrhythmias: Secondary | ICD-10-CM | POA: Insufficient documentation

## 2012-12-05 DIAGNOSIS — K449 Diaphragmatic hernia without obstruction or gangrene: Secondary | ICD-10-CM | POA: Insufficient documentation

## 2012-12-05 DIAGNOSIS — I440 Atrioventricular block, first degree: Secondary | ICD-10-CM | POA: Insufficient documentation

## 2012-12-05 HISTORY — DX: Gastro-esophageal reflux disease without esophagitis: K21.9

## 2012-12-05 HISTORY — DX: Anxiety disorder, unspecified: F41.9

## 2012-12-05 HISTORY — DX: Shortness of breath: R06.02

## 2012-12-05 HISTORY — DX: Personal history of other diseases of the digestive system: Z87.19

## 2012-12-05 HISTORY — DX: Unspecified convulsions: R56.9

## 2012-12-05 HISTORY — DX: Unspecified osteoarthritis, unspecified site: M19.90

## 2012-12-05 HISTORY — DX: Cardiac arrhythmia, unspecified: I49.9

## 2012-12-05 HISTORY — DX: Essential (primary) hypertension: I10

## 2012-12-05 HISTORY — DX: Headache: R51

## 2012-12-05 HISTORY — DX: Benign neoplasm of brain, unspecified: D33.2

## 2012-12-05 LAB — BASIC METABOLIC PANEL
GFR calc Af Amer: >90 mL/min (ref >90)
GFR calc non Af Amer: 79 mL/min — ABNORMAL LOW (ref >90)
Potassium: 4.2 mEq/L (ref 3.5–5.1)
Sodium: 138 mEq/L (ref 135–145)

## 2012-12-05 LAB — CBC WITH DIFFERENTIAL/PLATELET
Basophils Relative: 1 % (ref 0–1)
Hemoglobin: 15.5 g/dL — ABNORMAL HIGH (ref 12.0–15.0)
Lymphocytes Relative: 23 % (ref 12–46)
Lymphs Abs: 1.5 10*3/uL (ref 0.7–4.0)
Monocytes Relative: 12 % (ref 3–12)
Neutro Abs: 4.2 10*3/uL (ref 1.7–7.7)
Neutrophils Relative %: 62 % (ref 43–77)
RBC: 4.69 MIL/uL (ref 3.87–5.11)
WBC: 6.8 10*3/uL (ref 4.0–10.5)

## 2012-12-05 NOTE — Pre-Procedure Instructions (Signed)
Carrie Short  12/05/2012   Your procedure is scheduled on:  12/12/2012  Report to [facility] at 7:30  AM. Or as soon as you can after the visit to the Breast Center is completed   Call this number if you have problems the morning of surgery: [phone]   Remember:   Do not eat food or drink liquids after midnight.  ON SUNDAY  Take these medicines the morning of surgery with A SIP OF WATER: Lamictal ( Lamotrigine), Nadolol, Venlafaxine, (tylenol is OK)    Do not wear jewelry, make-up or nail polish.  Do not wear lotions, powders, or perfumes. You may wear deodorant.  Do not shave 48 hours prior to surgery.  Do not bring valuables to the hospital.  [facility] is not responsible                   for any belongings or valuables.  Contacts, dentures or bridgework may not be worn into surgery.  Leave suitcase in the car. After surgery it may be brought to your room.  For patients admitted to the hospital, checkout time is 11:00 AM the day of  discharge.   Patients discharged the day of surgery will not be allowed to drive  home.  Name and phone number of your driver: /w family  Special Instructions: Shower using CHG 2 nights before surgery and the night before surgery.  If you shower the day of surgery use CHG.  Use special wash - you have one bottle of CHG for all showers.  You should use approximately 1/3 of the bottle for each shower.   Please read over the following fact sheets that you were given: Pain Booklet, Coughing and Deep Breathing, MRSA Information and Surgical Site Infection Prevention

## 2012-12-11 MED ORDER — CEFAZOLIN SODIUM-DEXTROSE 2-3 GM-% IV SOLR
2.0000 g | INTRAVENOUS | Status: AC
  Administered 2012-12-12: 2 g via INTRAVENOUS
  Filled 2012-12-11: qty 50

## 2012-12-12 ENCOUNTER — Encounter (HOSPITAL_COMMUNITY)
Admission: RE | Disposition: A | Discharge status: Home / Self Care | Payer: Self-pay | Source: Ambulatory Visit | Attending: General Surgery

## 2012-12-12 ENCOUNTER — Encounter (HOSPITAL_COMMUNITY): Payer: Self-pay | Admitting: *Deleted

## 2012-12-12 ENCOUNTER — Encounter (HOSPITAL_COMMUNITY): Payer: Self-pay | Admitting: Certified Registered Nurse Anesthetist

## 2012-12-12 ENCOUNTER — Ambulatory Visit
Admission: RE | Admit: 2012-12-12 | Discharge: 2012-12-12 | Disposition: A | Discharge status: Home / Self Care | Payer: Medicare Other | Source: Ambulatory Visit | Attending: General Surgery | Admitting: General Surgery

## 2012-12-12 ENCOUNTER — Ambulatory Visit (HOSPITAL_COMMUNITY)
Admission: RE | Admit: 2012-12-12 | Discharge: 2012-12-12 | Disposition: A | Discharge status: Home / Self Care | Payer: Medicare Other | Source: Ambulatory Visit | Attending: General Surgery | Admitting: General Surgery

## 2012-12-12 ENCOUNTER — Ambulatory Visit (HOSPITAL_COMMUNITY): Payer: Medicare Other | Admitting: Certified Registered Nurse Anesthetist

## 2012-12-12 DIAGNOSIS — Z79899 Other long term (current) drug therapy: Secondary | ICD-10-CM | POA: Insufficient documentation

## 2012-12-12 DIAGNOSIS — D249 Benign neoplasm of unspecified breast: Secondary | ICD-10-CM

## 2012-12-12 DIAGNOSIS — I10 Essential (primary) hypertension: Secondary | ICD-10-CM | POA: Insufficient documentation

## 2012-12-12 DIAGNOSIS — N6310 Unspecified lump in the right breast, unspecified quadrant: Secondary | ICD-10-CM

## 2012-12-12 HISTORY — PX: BREAST BIOPSY: SHX20

## 2012-12-12 SURGERY — BREAST BIOPSY WITH NEEDLE LOCALIZATION
Anesthesia: General | Site: Breast | Laterality: Right | Wound class: Clean

## 2012-12-12 MED ORDER — SODIUM CHLORIDE 0.9 % IV SOLN: 250.0000 mL | INTRAVENOUS | Status: DC | PRN

## 2012-12-12 MED ORDER — SODIUM CHLORIDE 0.9 % IV SOLN: INTRAVENOUS | Status: DC

## 2012-12-12 MED ORDER — ONDANSETRON HCL 4 MG/2ML IJ SOLN: 4.0000 mg | Freq: Four times a day (QID) | INTRAMUSCULAR | Status: DC | PRN

## 2012-12-12 MED ORDER — ONDANSETRON HCL 4 MG/2ML IJ SOLN
INTRAMUSCULAR | Status: DC | PRN
  Administered 2012-12-12: 4 mg via INTRAVENOUS

## 2012-12-12 MED ORDER — SODIUM CHLORIDE 0.9 % IJ SOLN: 3.0000 mL | INTRAMUSCULAR | Status: DC | PRN

## 2012-12-12 MED ORDER — HYDROCODONE-ACETAMINOPHEN 10-325 MG PO TABS: 1.0000 | ORAL_TABLET | Freq: Four times a day (QID) | ORAL | Status: DC | PRN

## 2012-12-12 MED ORDER — SODIUM CHLORIDE 0.9 % IJ SOLN: 3.0000 mL | Freq: Two times a day (BID) | INTRAMUSCULAR | Status: DC

## 2012-12-12 MED ORDER — LACTATED RINGERS IV SOLN
INTRAVENOUS | Status: DC
  Administered 2012-12-12: 10:00:00 via INTRAVENOUS

## 2012-12-12 MED ORDER — OXYCODONE HCL 5 MG/5ML PO SOLN: 5.0000 mg | Freq: Once | ORAL | Status: DC | PRN

## 2012-12-12 MED ORDER — FENTANYL CITRATE 0.05 MG/ML IJ SOLN: 25.0000 ug | INTRAMUSCULAR | Status: DC | PRN

## 2012-12-12 MED ORDER — BUPIVACAINE-EPINEPHRINE PF 0.25-1:200000 % IJ SOLN
INTRAMUSCULAR | Status: AC
  Filled 2012-12-12: qty 30

## 2012-12-12 MED ORDER — MORPHINE SULFATE 2 MG/ML IJ SOLN: 2.0000 mg | INTRAMUSCULAR | Status: DC | PRN

## 2012-12-12 MED ORDER — OXYCODONE HCL 5 MG PO TABS: 5.0000 mg | ORAL_TABLET | Freq: Once | ORAL | Status: DC | PRN

## 2012-12-12 MED ORDER — ACETAMINOPHEN 325 MG PO TABS: 650.0000 mg | ORAL_TABLET | ORAL | Status: DC | PRN

## 2012-12-12 MED ORDER — ACETAMINOPHEN 650 MG RE SUPP: 650.0000 mg | RECTAL | Status: DC | PRN

## 2012-12-12 MED ORDER — BUPIVACAINE HCL (PF) 0.25 % IJ SOLN
INTRAMUSCULAR | Status: AC
  Filled 2012-12-12: qty 30

## 2012-12-12 MED ORDER — LACTATED RINGERS IV SOLN
INTRAVENOUS | Status: DC | PRN
  Administered 2012-12-12: 10:00:00 via INTRAVENOUS

## 2012-12-12 MED ORDER — LIDOCAINE HCL (CARDIAC) 20 MG/ML IV SOLN
INTRAVENOUS | Status: DC | PRN
  Administered 2012-12-12: 80 mg via INTRAVENOUS

## 2012-12-12 MED ORDER — EPHEDRINE SULFATE 50 MG/ML IJ SOLN
INTRAMUSCULAR | Status: DC | PRN
  Administered 2012-12-12 (×3): 5 mg via INTRAVENOUS

## 2012-12-12 MED ORDER — 0.9 % SODIUM CHLORIDE (POUR BTL) OPTIME
TOPICAL | Status: DC | PRN
  Administered 2012-12-12: 1000 mL

## 2012-12-12 MED ORDER — BUPIVACAINE HCL 0.25 % IJ SOLN
INTRAMUSCULAR | Status: DC | PRN
  Administered 2012-12-12: 19 mL

## 2012-12-12 MED ORDER — FENTANYL CITRATE 0.05 MG/ML IJ SOLN
INTRAMUSCULAR | Status: DC | PRN
  Administered 2012-12-12 (×2): 25 ug via INTRAVENOUS

## 2012-12-12 MED ORDER — PROPOFOL 10 MG/ML IV BOLUS
INTRAVENOUS | Status: DC | PRN
  Administered 2012-12-12: 150 mg via INTRAVENOUS

## 2012-12-12 MED ORDER — OXYCODONE HCL 5 MG PO TABS: 5.0000 mg | ORAL_TABLET | ORAL | Status: DC | PRN

## 2012-12-12 SURGICAL SUPPLY — 46 items
ADH SKN CLS APL DERMABOND .7 (GAUZE/BANDAGES/DRESSINGS) ×1
APPLIER CLIP 9.375 MED OPEN (MISCELLANEOUS)
APR CLP MED 9.3 20 MLT OPN (MISCELLANEOUS)
BINDER BREAST LRG (GAUZE/BANDAGES/DRESSINGS) IMPLANT
BINDER BREAST XLRG (GAUZE/BANDAGES/DRESSINGS) IMPLANT
CANISTER SUCTION 2500CC (MISCELLANEOUS) IMPLANT
CHLORAPREP W/TINT 26ML (MISCELLANEOUS) ×2 IMPLANT
CLIP APPLIE 9.375 MED OPEN (MISCELLANEOUS) IMPLANT
CLOTH BEACON ORANGE TIMEOUT ST (SAFETY) ×2 IMPLANT
COVER SURGICAL LIGHT HANDLE (MISCELLANEOUS) ×2 IMPLANT
DECANTER SPIKE VIAL GLASS SM (MISCELLANEOUS) ×2 IMPLANT
DERMABOND ADVANCED (GAUZE/BANDAGES/DRESSINGS) ×1
DERMABOND ADVANCED .7 DNX12 (GAUZE/BANDAGES/DRESSINGS) IMPLANT
DEVICE DUBIN SPECIMEN MAMMOGRA (MISCELLANEOUS) ×2 IMPLANT
DRAPE CHEST BREAST 15X10 FENES (DRAPES) ×2 IMPLANT
ELECT CAUTERY BLADE 6.4 (BLADE) ×2 IMPLANT
ELECT REM PT RETURN 9FT ADLT (ELECTROSURGICAL) ×2
ELECTRODE REM PT RTRN 9FT ADLT (ELECTROSURGICAL) ×1 IMPLANT
GLOVE BIO SURGEON STRL SZ7 (GLOVE) ×2 IMPLANT
GLOVE BIOGEL PI IND STRL 6.5 (GLOVE) IMPLANT
GLOVE BIOGEL PI IND STRL 7.0 (GLOVE) IMPLANT
GLOVE BIOGEL PI IND STRL 7.5 (GLOVE) ×1 IMPLANT
GLOVE BIOGEL PI INDICATOR 6.5 (GLOVE) ×1
GLOVE BIOGEL PI INDICATOR 7.0 (GLOVE) ×1
GLOVE BIOGEL PI INDICATOR 7.5 (GLOVE) ×1
GLOVE SURG SS PI 6.5 STRL IVOR (GLOVE) ×1 IMPLANT
GLOVE SURG SS PI 7.5 STRL IVOR (GLOVE) ×1 IMPLANT
GOWN STRL NON-REIN LRG LVL3 (GOWN DISPOSABLE) ×5 IMPLANT
KIT BASIN OR (CUSTOM PROCEDURE TRAY) ×2 IMPLANT
KIT MARKER MARGIN INK (KITS) IMPLANT
KIT ROOM TURNOVER OR (KITS) ×2 IMPLANT
NDL HYPO 25GX1X1/2 BEV (NEEDLE) ×1 IMPLANT
NEEDLE HYPO 25GX1X1/2 BEV (NEEDLE) ×2 IMPLANT
NS IRRIG 1000ML POUR BTL (IV SOLUTION) ×2 IMPLANT
PACK GENERAL/GYN (CUSTOM PROCEDURE TRAY) ×2 IMPLANT
PAD ARMBOARD 7.5X6 YLW CONV (MISCELLANEOUS) ×2 IMPLANT
STAPLER VISISTAT 35W (STAPLE) ×2 IMPLANT
STRIP CLOSURE SKIN 1/2X4 (GAUZE/BANDAGES/DRESSINGS) ×1 IMPLANT
SUT MNCRL AB 4-0 PS2 18 (SUTURE) ×2 IMPLANT
SUT SILK 2 0 SH (SUTURE) IMPLANT
SUT VIC AB 2-0 SH 27 (SUTURE) ×2
SUT VIC AB 2-0 SH 27XBRD (SUTURE) IMPLANT
SUT VIC AB 3-0 SH 18 (SUTURE) ×2 IMPLANT
SYR CONTROL 10ML LL (SYRINGE) ×2 IMPLANT
TOWEL OR 17X24 6PK STRL BLUE (TOWEL DISPOSABLE) ×2 IMPLANT
TOWEL OR 17X26 10 PK STRL BLUE (TOWEL DISPOSABLE) ×2 IMPLANT

## 2012-12-12 NOTE — Transfer of Care (Signed)
Immediate Anesthesia Transfer of Care Note  Patient: Carrie Short  Procedure(s) Performed: Procedure(s): Right breast wire guided excision (Right)  Patient Location: PACU  Anesthesia Type:General  Level of Consciousness: awake, alert  and oriented  Airway & Oxygen Therapy: Patient Spontanous Breathing and Patient connected to nasal cannula oxygen  Post-op Assessment: Report given to PACU RN and Post -op Vital signs reviewed and stable  Post vital signs: Reviewed and stable  Complications: No apparent anesthesia complications

## 2012-12-12 NOTE — Op Note (Signed)
Preoperative diagnosis: right breast mass with core biopsy showing intraductal papilloma Postoperative diagnosis: same as above Procedure: Right breast wire guided excisional biopsy Surgeon: Dr Harden Mo Anesthesia: general EBL: minimal Drains: none Specimen: right breast tissue marked with short stitch superior, long stitch lateral, double stitch deep Complications: none Disposition to recovery stable  Indications: This is an 11 yof who had some right nipple discharge and was noted on evaluation to have a right breast mass.  This underwent a biopsy that showed an intraductal papilloma.  She was recommended for excision.  We discussed options and decided to proceed with excisional biopsy.  Procedure: After informed consent was obtained the patient first had a wire placed at the breast center.  Mammograms were available in the operating room.  Cefazolin was administered.  SCDs were administered.  She was placed under general anesthesia without complication.  A surgical timeout was then performed.    I infiltrated marcaine throughout the surgical area.  I made a periareolar incision.  I then brought the wire in from remotely.  I then removed the wire and the surrounding subareolar tissue.  This was marked as above.  Mammogram confirmed removal of wire and clip.  This was also confirmed by radiology.  Hemostasis was observed.  I then closed the breast tissue with 2-0 vicryl.  The dermis was closed with 3-0 vicryl.  The skin was closed with 4-0 monocryl, dermabond and steristrips.  She tolerated this well, was extubated, and transferred to recovery stable.

## 2012-12-12 NOTE — Anesthesia Postprocedure Evaluation (Signed)
Anesthesia Post Note  Patient: Carrie Short  Procedure(s) Performed: Procedure(s) (LRB): Right breast wire guided excision (Right)  Anesthesia type: General  Patient location: PACU  Post pain: Pain level controlled and Adequate analgesia  Post assessment: Post-op Vital signs reviewed, Patient's Cardiovascular Status Stable, Respiratory Function Stable, Patent Airway and Pain level controlled  Last Vitals:  Filed Vitals:   12/12/12 1130  BP: 154/74  Pulse: 59  Temp:   Resp: 14    Post vital signs: Reviewed and stable  Level of consciousness: awake, alert  and oriented  Complications: No apparent anesthesia complications

## 2012-12-12 NOTE — Interval H&P Note (Signed)
History and Physical Interval Note:  12/12/2012 10:01 AM  Carrie Short  has presented today for surgery, with the diagnosis of right breast mass  The various methods of treatment have been discussed with the patient and family. After consideration of risks, benefits and other options for treatment, the patient has consented to  Procedure(s): Right breast wire guided excision (Right) as a surgical intervention .  The patient's history has been reviewed, patient examined, no change in status, stable for surgery.  I have reviewed the patient's chart and labs.  Questions were answered to the patient's satisfaction.     Braun Rocca

## 2012-12-12 NOTE — Anesthesia Preprocedure Evaluation (Signed)
Anesthesia Evaluation  Patient identified by MRN, date of birth, ID band Patient awake    Reviewed: Allergy & Precautions, H&P , NPO status , Patient's Chart, lab work & pertinent test results  Airway Mallampati: II  Neck ROM: full    Dental   Pulmonary shortness of breath,          Cardiovascular hypertension,     Neuro/Psych  Headaches, Seizures -,     GI/Hepatic hiatal hernia, GERD-  ,  Endo/Other    Renal/GU      Musculoskeletal  (+) Arthritis -,   Abdominal   Peds  Hematology   Anesthesia Other Findings   Reproductive/Obstetrics                           Anesthesia Physical Anesthesia Plan  ASA: III  Anesthesia Plan: General   Post-op Pain Management:    Induction: Intravenous  Airway Management Planned: LMA  Additional Equipment:   Intra-op Plan:   Post-operative Plan:   Informed Consent: I have reviewed the patients History and Physical, chart, labs and discussed the procedure including the risks, benefits and alternatives for the proposed anesthesia with the patient or authorized representative who has indicated his/her understanding and acceptance.     Plan Discussed with: CRNA, Anesthesiologist and Surgeon  Anesthesia Plan Comments:         Anesthesia Quick Evaluation

## 2012-12-12 NOTE — Preoperative (Signed)
Beta Blockers   Reason not to administer Beta Blockers:Not Applicable 

## 2012-12-12 NOTE — H&P (View-Only) (Signed)
Patient ID: Carrie Short, female   DOB: Apr 26, 1927, 79 y.o.   MRN: [id number]  Chief Complaint  Patient presents with  . New Evaluation    rt breast     HPI [name] is a 79 y.o. female.  Referred by Dr Su Hilt HPI 35 yof with a couple of episodes of right nipple discharge one of which appears to be spontaneous and the other expressed.  She reports no other complaints referable to her breasts.  She underwent mm that shows no real abnormality.  An US shows an intraductal mass at 2 o'clock 2 cm from the right nipple measuring 4x8x3 mm.  Core biopsy was obtained that showed fragments of intraductal papilloma. She comes in today with no more discharge to discuss her options.  No past medical history on file. History of brain biopsy now being followed by mri benign, htn  Past Surgical History  Procedure Laterality Date  . Abdominal hysterectomy  1990s  . Brain biopsy  2010  . Bunionectomy      left foot    Family History  Problem Relation Age of Onset  . Heart disease Mother   . Cancer Daughter     breast mets to lung  . Diabetes Son     Social History History  Substance Use Topics  . Smoking status: Never Smoker   . Smokeless tobacco: Not on file  . Alcohol Use: Not on file    No Known Allergies  Current Outpatient Prescriptions  Medication Sig Dispense Refill  . celecoxib (CELEBREX) 200 MG capsule Take 200 mg by mouth 2 (two) times daily.      . diazepam (VALIUM) 2 MG tablet Take 2 mg by mouth every 6 (six) hours as needed for anxiety.      . lamoTRIgine (LAMICTAL) 100 MG tablet       . LORazepam (ATIVAN) 1 MG tablet Take 1 mg by mouth every 8 (eight) hours.      . nadolol (CORGARD) 20 MG tablet       . Pseudoeph-Doxylamine-DM-APAP (NYQUIL PO) Take by mouth.      Marland Kitchen rOPINIRole (REQUIP) 0.5 MG tablet Take 1 tablet (0.5 mg total) by mouth at bedtime.  90 tablet  0  . venlafaxine XR (EFFEXOR-XR) 75 MG 24 hr capsule Take 1 capsule (75 mg total) by mouth daily.  90 capsule   3  . predniSONE (DELTASONE) 10 MG tablet        No current facility-administered medications for this visit.    Review of Systems Review of Systems  Constitutional: Negative for fever, chills and unexpected weight change.  HENT: Negative for hearing loss, congestion, sore throat, trouble swallowing and voice change.   Eyes: Negative for visual disturbance.  Respiratory: Negative for cough and wheezing.   Cardiovascular: Positive for palpitations. Negative for chest pain and leg swelling.  Gastrointestinal: Negative for nausea, vomiting, abdominal pain, diarrhea, constipation, blood in stool, abdominal distention and anal bleeding.  Genitourinary: Negative for hematuria, vaginal bleeding and difficulty urinating.  Musculoskeletal: Positive for arthralgias.  Skin: Negative for rash and wound.  Neurological: Negative for seizures, syncope and headaches.  Hematological: Negative for adenopathy. Does not bruise/bleed easily.  Psychiatric/Behavioral: Positive for confusion.    Blood pressure 130/72, pulse 74, temperature 98 F (36.7 C), resp. rate 18, height 5\' 3"  (1.6 m), weight 167 lb (75.751 kg).  Physical Exam Physical Exam  Vitals reviewed. Constitutional: She appears well-developed and well-nourished.  Eyes: No scleral icterus.  Neck: Neck  supple.  Cardiovascular: Normal rate, regular rhythm and normal heart sounds.   Pulmonary/Chest: Effort normal and breath sounds normal. She has no wheezes. She has no rales. Right breast exhibits no inverted nipple, no mass, no nipple discharge, no skin change and no tenderness. Left breast exhibits no inverted nipple, no mass, no nipple discharge, no skin change and no tenderness.  Abdominal: Soft. There is no tenderness.  Lymphadenopathy:    She has no cervical adenopathy.    She has no axillary adenopathy.       Right: No supraclavicular adenopathy present.       Left: No supraclavicular adenopathy present.    Data Reviewed DIGITAL  DIAGNOSTIC BILATERAL MAMMOGRAM WITH CAD AND RIGHT BREAST  ULTRASOUND:  Comparison: 06/03/2011, 04/30/2010, 03/12/2008, 02/18/2007  Findings:  ACR Breast Density Category b: There are scattered areas of  fibroglandular density.  There are multiple nonspecific nodules scattered throughout both  breasts. No suspicious calcifications are noted.  Mammographic images were processed with CAD.  On physical exam, I visualize a tiny amount of fluid emanating from  a duct in the upper inner quadrant of the right nipple.  Ultrasound is performed, showing an intraductal mass at 2 o'clock 2  cm from the right nipple measuring 4 x 8 6 x 3 mm. A simple cyst  is noted at 9 o'clock 2 cm from the right nipple.IMPRESSION:  Intraductal mass at 2 o'clock 2 cm from the right nipple.  RECOMMENDATION:  Ultrasound-guided core needle biopsy is suggested. This has been  scheduled for 11/03/2012 at 1:00 p.m.    Assessment    Right breast mass with nipple discharge     Plan    Right breast wire localized excision  We discussed options including observation vs wire guided excision.  We will plan on removing this mass and discussed performance, risks and benefits with that.          [name] 11/18/2012, 11:02 AM

## 2012-12-13 ENCOUNTER — Encounter (HOSPITAL_COMMUNITY): Payer: Self-pay | Admitting: General Surgery

## 2012-12-15 ENCOUNTER — Telehealth (INDEPENDENT_AMBULATORY_CARE_PROVIDER_SITE_OTHER): Payer: Self-pay | Admitting: General Surgery

## 2012-12-15 NOTE — Telephone Encounter (Signed)
S/p breast biopsy with path showing papiloma. I discussed this with patient.

## 2012-12-26 ENCOUNTER — Ambulatory Visit: Payer: Self-pay | Admitting: Diagnostic Neuroimaging

## 2012-12-26 ENCOUNTER — Encounter: Payer: Self-pay | Admitting: Diagnostic Neuroimaging

## 2012-12-26 ENCOUNTER — Ambulatory Visit (INDEPENDENT_AMBULATORY_CARE_PROVIDER_SITE_OTHER): Payer: Medicare Other | Admitting: Diagnostic Neuroimaging

## 2012-12-26 VITALS — BP 129/73 | HR 60 | Temp 97.8°F | Ht 62.0 in | Wt 167.0 lb

## 2012-12-26 DIAGNOSIS — R9089 Other abnormal findings on diagnostic imaging of central nervous system: Secondary | ICD-10-CM

## 2012-12-26 DIAGNOSIS — R413 Other amnesia: Secondary | ICD-10-CM

## 2012-12-26 DIAGNOSIS — G40209 Localization-related (focal) (partial) symptomatic epilepsy and epileptic syndromes with complex partial seizures, not intractable, without status epilepticus: Secondary | ICD-10-CM

## 2012-12-26 DIAGNOSIS — R930 Abnormal findings on diagnostic imaging of skull and head, not elsewhere classified: Secondary | ICD-10-CM

## 2012-12-26 NOTE — Patient Instructions (Addendum)
Discuss with family about living situation and driving. I recommend that you transition to more supervised living environment. I recommend that you transition away from driving.  Continue current medications.

## 2012-12-26 NOTE — Progress Notes (Signed)
[facility]  PATIENT: Carrie Short DOB: 25-Mar-1928  REFERRING CLINICIAN:  HISTORY FROM: patient REASON FOR VISIT: follow up   HISTORICAL  CHIEF COMPLAINT:  Chief Complaint  Patient presents with  . Neurologic Problem    Dr. Sandria Manly transfer,  memory, unable to sleep    HISTORY OF PRESENT ILLNESS:   UPDATE 12/26/12: Since last visit, stable. Some intermittent HA, left sided. Some intermittent left facial numbness. Some memory loss. Got lost driving to this appt this AM, and had to call her daughter for directions.  Still lives alone (with dog). Daughter and son call daily to check on her.  PRIOR HPI (06/13/12, Dr. Sandria Manly):  79 year old right-handed white widowed female with a 3-1/2 year history of hesitancy in speech, using neologisms and making inappropriate comments, at times associated with a blank stare. I saw her 12/08/2008 with normal neurologic examination, MMSE 25/30, CDT 4/4, AFT 10.  I started her on Aricept,venlafaxine was increased, and aspirin was continued. Evaluation included sedimentation rate, RPR, TSH, B12, and  CPK which were normal. MRI study of the brain 06/07/2008 was felt to be normal. CT scans of the brain 05/06/2007 and  10/12/2008 showed minimal small vessel disease and atrophy. EEG 12/13/2008 showed left-sided intermittent  slowing in the temporal  region. Repeat MRI study 03/04/2009 showed an asymmetry in the left temporal lobe versus the right, being larger, and having T1 signal alterations on the medial aspect and brighter signal in the left mesial temporal lobe on flair. 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 In review of the MRI 06/07/2008. Dr. Lavonna Monarch at [facility] performed a stereotactic left temporal  biopsy 06/13/2009 with no pathologic diagnosis of  tumor present.  There was astrocytic hypertrophy and there were "hypertensive vascular changes present".  She did not feel "generally  well" without documented seizures. She complained of lack of energy.She was taken off of levetiracetam and  started on lamotrigine, progressing to 100 mg, one half twice daily. She states that she is confused at times with difficulty thinking of words. She denies auras of seizures. She occasionally stumbles. She does not notice language problems. Functionally she can cook, shop, clean, do laundry, make the bed and dishes, and mow the yard. She drives. She lives alone She has lost interest in traveling.  She has spells that come over her with ringing of her hands and moving her feet and cannot  be still. These occur while sitting or at nighttime without loss of consciousness  and last 15 minutes. She treats for restless leg symptoms with Aleve.They usually occur when she is going to bed or sitting on the sofa.She occasionally has left-sided headache.  She was  seen by Dr. Velna Ochs 11/30/2011. Her MRI report 11/29/2011 was not changed in the left hypertrophied temporal lobe according to the patient.she is to see him in two years. She says her memory is okay. She enjoys socializing with a group of friends at [employer] having her breakfast. 06/13/2012=( MMSE 25/30. Clock drawing task 4/4.Animal fluency test 9. Myrtis Ser  Index of independence in activities of daily 6. Golda Acre instrumental activities of daily living scale 8. Neuropsychiatric inventory=  Apathy 4. Geriatric depression scale 1/15.Falls assessment tool score 15).   REVIEW OF SYSTEMS: Full 14 system review of systems performed and notable only for insomnia.  ALLERGIES: No Known Allergies  HOME MEDICATIONS:  Outpatient Prescriptions Prior to Visit  Medication Sig Dispense  Refill  . acetaminophen (TYLENOL) 325 MG tablet Take 650 mg by mouth every 6 (six) hours as needed for pain.      . calcium carbonate (TUMS EX) 750 MG chewable tablet Chew 1 tablet by mouth daily as needed for heartburn.      . Calcium Carbonate-Vitamin D (CALCIUM 600 + D  PO) Take 1 tablet by mouth daily.       . celecoxib (CELEBREX) 200 MG capsule Take 200 mg by mouth daily as needed (for arthritis pain).       . diazepam (VALIUM) 2 MG tablet Take 2 mg by mouth every 6 (six) hours as needed for anxiety.      Marland Kitchen HYDROcodone-acetaminophen (NORCO) 10-325 MG per tablet Take 1 tablet by mouth every 6 (six) hours as needed for pain.  20 tablet  0  . lamoTRIgine (LAMICTAL) 100 MG tablet Take 150 mg by mouth daily. Whole pill in the a.m. , 1/2 pill at dinner to equal 150mg .      . LORazepam (ATIVAN) 1 MG tablet Take 1 mg by mouth daily as needed (for sleep).      . Multiple Vitamin (MULTIVITAMIN) capsule Take 1 capsule by mouth daily before breakfast.      . nadolol (CORGARD) 20 MG tablet Take 20 mg by mouth daily before breakfast.       . naproxen sodium (ANAPROX) 220 MG tablet Take 440 mg by mouth 2 (two) times daily with a meal.      . omeprazole (PRILOSEC) 20 MG capsule Take 20 mg by mouth daily as needed (usually- only needs 2x's /week).      Marland Kitchen OVER THE COUNTER MEDICATION Take 5 mL by mouth at bedtime as needed ("[id number]" for sleep).       Marland Kitchen rOPINIRole (REQUIP) 0.5 MG tablet Take 1 tablet (0.5 mg total) by mouth at bedtime.  90 tablet  0  . venlafaxine XR (EFFEXOR-XR) 75 MG 24 hr capsule Take 75 mg by mouth daily before breakfast.       No facility-administered medications prior to visit.    PAST MEDICAL HISTORY: Past Medical History  Diagnosis Date  . Hypertension   . Dysrhythmia     "my heart skips some beats"  . Anxiety   . Shortness of breath     /w exertion   . Brain tumor (benign) 2010    has consulted /w Dr. Zachery Conch at [facility], AUG 2013- had MRI  . GERD (gastroesophageal reflux disease)   . H/O hiatal hernia   . Seizures     prior to diagnosed /w brain tumor, on lamictal since    . Arthritis     L arm, hips & knees - recently had injection in R  knee - Dr. Burton Apley   . Headache(784.0)     pt. believes the recent headaches are related to  "brain tumor", states she has a followup appt. wuith Neurologist in Sept.    . High cholesterol     PAST SURGICAL HISTORY: Past Surgical History  Procedure Laterality Date  . Abdominal hysterectomy  1990s  . Brain biopsy  2010    L temple  . Bunionectomy      left foot  . Breast biopsy Right 12/12/2012    Procedure: Right breast wire guided excision;  Surgeon: Emelia Loron, MD;  Location: [facility] OR;  Service: General;  Laterality: Right;  . Knee surgery Left 2006    FAMILY HISTORY: Family History  Problem Relation Age of Onset  .  Heart disease Mother   . Heart attack Mother   . Cancer Daughter     breast mets to lung  . Diabetes Son   . Colon cancer Father     SOCIAL HISTORY:  History   Social History  . Marital Status: Widowed    Spouse Name: N/A    Number of Children: 2  . Years of Education: College   Occupational History  . Retired    Social History Main Topics  . Smoking status: Never Smoker   . Smokeless tobacco: Never Used  . Alcohol Use: No  . Drug Use: No  . Sexual Activity: Not on file   Other Topics Concern  . Not on file   Social History Narrative   Patient lives at home alone.   Caffeine Use: 1-2 cup of caffeine daily    PHYSICAL EXAM  Filed Vitals:   12/26/12 1148  BP: 129/73  Pulse: 60  Temp: 97.8 F (36.6 C)  TempSrc: Oral  Height: 5\' 2"  (1.575 m)  Weight: 167 lb (75.751 kg)    Not recorded    Body mass index is 30.54 kg/(m^2).  GENERAL EXAM: Patient is in no distress  CARDIOVASCULAR: Regular rate and rhythm, no murmurs, no carotid bruits  NEUROLOGIC: MENTAL STATUS: awake, alert, language fluent, comprehension intact, naming intact CRANIAL NERVE: no papilledema on fundoscopic exam, pupils equal and reactive to light, visual fields full to confrontation, extraocular muscles intact, no nystagmus, facial sensation and strength symmetric, uvula midline, shoulder shrug symmetric, tongue midline. MOTOR: normal bulk and tone,  full strength in the BUE, BLE SENSORY: normal and symmetric to light touch COORDINATION: finger-nose-finger, fine finger movements normal REFLEXES: deep tendon reflexes present and symmetric GAIT/STATION: narrow based gait; SLOW TO STAND. UNSTEADY GAIT. STOOPED POSTURE.    DIAGNOSTIC DATA (LABS, IMAGING, TESTING) - I reviewed patient records, labs, notes, testing and imaging myself where available.  Lab Results  Component Value Date   WBC 6.8 12/05/2012   HGB 15.5* 12/05/2012   HCT 45.4 12/05/2012   MCV 96.8 12/05/2012   PLT 202 12/05/2012      Component Value Date/Time   NA 138 12/05/2012 1301   K 4.2 12/05/2012 1301   CL 103 12/05/2012 1301   CO2 28 12/05/2012 1301   GLUCOSE 75 12/05/2012 1301   BUN 11 12/05/2012 1301   CREATININE 0.66 12/05/2012 1301   CALCIUM 9.8 12/05/2012 1301   GFRNONAA 79* 12/05/2012 1301   GFRAA >90 12/05/2012 1301   No results found for this basename: CHOL, HDL, LDLCALC, LDLDIRECT, TRIG, CHOLHDL   No results found for this basename: HGBA1C   No results found for this basename: VITAMINB12   No results found for this basename: TSH   06/29/12 MRI brain (with and without contrast) demonstrating: 1. Slight asymmetry of the mesial temporal lobes, with the left side slightly larger than the right. 2. Mild perisylvian and right mesial temporal atrophy.  3. Mild periventricular and subcortical non-specific foci of gliosis, likely chronic small vessel ischemic disease. 4. Mucosal thickening and bubbly secretions in the ethmoid, frontal and maxillary sinuses. 5. Compared to prior MRI from 03/04/09 there is no significant interval change.  ASSESSMENT AND PLAN  79 y.o. year old female here with history of complex partial seizures, abnormal MRI brain (s/p left temporal lobe biopsy --> astrocytic hypertrophy and hypertensive vascular changes). Not clearly neoplastic.  PLAN: - Continue lamotrigine for seizure prevention - Continue ropinirole for RLS - caution with  driving and living alone (  she got lost coming here today; having more memory problems)  Return in about 6 months (around 06/25/2013).    Suanne Marker, MD 12/26/2012, 1:23 PM Certified in Neurology, Neurophysiology and Neuroimaging  [facility] [address] [place], [place] [address] [phone]

## 2012-12-27 ENCOUNTER — Encounter (INDEPENDENT_AMBULATORY_CARE_PROVIDER_SITE_OTHER): Payer: Self-pay | Admitting: General Surgery

## 2012-12-27 ENCOUNTER — Ambulatory Visit (INDEPENDENT_AMBULATORY_CARE_PROVIDER_SITE_OTHER): Payer: Medicare Other | Admitting: General Surgery

## 2012-12-27 VITALS — BP 132/78 | HR 70 | Resp 18 | Ht 63.0 in | Wt 165.0 lb

## 2012-12-27 DIAGNOSIS — Z09 Encounter for follow-up examination after completed treatment for conditions other than malignant neoplasm: Secondary | ICD-10-CM

## 2012-12-27 NOTE — Patient Instructions (Signed)

## 2012-12-27 NOTE — Progress Notes (Signed)
Subjective:     Patient ID: Carrie Short, female   DOB: 1928/03/14, 79 y.o.   MRN: [id number]  HPI This is an 79 year old female who underwent a right breast wire-guided biopsy for a mammographic lesion. She has done well he returns today with no complaints. Her pathology showed a benign intraductal papilloma.  Review of Systems     Objective:   Physical Exam Healing right breast periareolar incision without infection    Assessment:     Status post right breast biopsy, benign     Plan:     She is going to return to full activity. She will come back and see me as needed. She understands she needs to do routine breast cancer screening follow up with monthly exams, yearly clinical exams, and yearly mammography.

## 2013-01-18 ENCOUNTER — Other Ambulatory Visit: Payer: Self-pay | Admitting: Diagnostic Neuroimaging

## 2013-04-26 ENCOUNTER — Other Ambulatory Visit (HOSPITAL_COMMUNITY): Payer: Self-pay | Admitting: Internal Medicine

## 2013-04-26 DIAGNOSIS — R0602 Shortness of breath: Secondary | ICD-10-CM

## 2013-05-01 ENCOUNTER — Ambulatory Visit (HOSPITAL_COMMUNITY)
Admission: RE | Admit: 2013-05-01 | Discharge: 2013-05-01 | Disposition: A | Discharge status: Home / Self Care | Payer: Medicare Other | Source: Ambulatory Visit | Attending: Internal Medicine | Admitting: Internal Medicine

## 2013-05-01 ENCOUNTER — Other Ambulatory Visit: Payer: Self-pay | Admitting: Internal Medicine

## 2013-05-01 ENCOUNTER — Ambulatory Visit
Admission: RE | Admit: 2013-05-01 | Discharge: 2013-05-01 | Disposition: A | Discharge status: Home / Self Care | Payer: Medicare Other | Source: Ambulatory Visit | Attending: Internal Medicine | Admitting: Internal Medicine

## 2013-05-01 DIAGNOSIS — R0602 Shortness of breath: Secondary | ICD-10-CM

## 2013-05-01 DIAGNOSIS — I77819 Aortic ectasia, unspecified site: Secondary | ICD-10-CM | POA: Insufficient documentation

## 2013-05-01 DIAGNOSIS — I059 Rheumatic mitral valve disease, unspecified: Secondary | ICD-10-CM

## 2013-05-01 DIAGNOSIS — R0609 Other forms of dyspnea: Secondary | ICD-10-CM | POA: Insufficient documentation

## 2013-05-01 DIAGNOSIS — R0989 Other specified symptoms and signs involving the circulatory and respiratory systems: Principal | ICD-10-CM | POA: Insufficient documentation

## 2013-05-01 NOTE — Progress Notes (Signed)
Echocardiogram 2D Echocardiogram has been performed.  Joelene Millin 05/01/2013, 2:20 PM

## 2013-05-19 ENCOUNTER — Other Ambulatory Visit: Payer: Self-pay | Admitting: Internal Medicine

## 2013-05-19 DIAGNOSIS — J40 Bronchitis, not specified as acute or chronic: Secondary | ICD-10-CM

## 2013-05-23 ENCOUNTER — Ambulatory Visit
Admission: RE | Admit: 2013-05-23 | Discharge: 2013-05-23 | Disposition: A | Discharge status: Home / Self Care | Payer: Medicare Other | Source: Ambulatory Visit | Attending: Internal Medicine | Admitting: Internal Medicine

## 2013-05-23 DIAGNOSIS — J40 Bronchitis, not specified as acute or chronic: Secondary | ICD-10-CM

## 2013-06-28 ENCOUNTER — Encounter: Payer: Self-pay | Admitting: Diagnostic Neuroimaging

## 2013-06-28 ENCOUNTER — Encounter (INDEPENDENT_AMBULATORY_CARE_PROVIDER_SITE_OTHER): Payer: Self-pay

## 2013-06-28 ENCOUNTER — Ambulatory Visit (INDEPENDENT_AMBULATORY_CARE_PROVIDER_SITE_OTHER): Payer: Medicare Other | Admitting: Diagnostic Neuroimaging

## 2013-06-28 VITALS — BP 172/80 | HR 61 | Temp 97.8°F | Ht 63.0 in | Wt 165.0 lb

## 2013-06-28 DIAGNOSIS — G20 Parkinson's disease: Secondary | ICD-10-CM

## 2013-06-28 DIAGNOSIS — R413 Other amnesia: Secondary | ICD-10-CM

## 2013-06-28 DIAGNOSIS — G40209 Localization-related (focal) (partial) symptomatic epilepsy and epileptic syndromes with complex partial seizures, not intractable, without status epilepticus: Secondary | ICD-10-CM

## 2013-06-28 DIAGNOSIS — R930 Abnormal findings on diagnostic imaging of skull and head, not elsewhere classified: Secondary | ICD-10-CM

## 2013-06-28 DIAGNOSIS — R9089 Other abnormal findings on diagnostic imaging of central nervous system: Secondary | ICD-10-CM

## 2013-06-28 DIAGNOSIS — G2581 Restless legs syndrome: Secondary | ICD-10-CM | POA: Insufficient documentation

## 2013-06-28 MED ORDER — ROPINIROLE HCL 0.5 MG PO TABS: 0.5000 mg | ORAL_TABLET | Freq: Two times a day (BID) | ORAL | Status: DC

## 2013-06-28 NOTE — Progress Notes (Signed)
[facility]  PATIENT: Carrie Short DOB: [birth year]  REFERRING CLINICIAN:  HISTORY FROM: patient REASON FOR VISIT: follow up   HISTORICAL  CHIEF COMPLAINT:  Chief Complaint  Patient presents with  . Follow-up    memory    HISTORY OF PRESENT ILLNESS:   UPDATE 06/28/13: Since last visit, more knee/hip/back pain. Restless legs slightly worse. Now with tremor (with handwriting). More balance difficulty. Also with some shortness of breath and emphysematous changes on lung testing.  UPDATE 12/26/12: Since last visit, stable. Some intermittent HA, left sided. Some intermittent left facial numbness. Some memory loss. Got lost driving to this appt this AM, and had to call her daughter for directions.  Still lives alone (with dog). Daughter and son call daily to check on her.  PRIOR HPI (06/13/12, Dr. Erling Cruz):  80 year old right-handed white widowed female with a 3-1/2 year history of hesitancy in speech, using neologisms and making inappropriate comments, at times associated with a blank stare. I saw her 12/08/2008 with normal neurologic examination, MMSE 25/30, CDT 4/4, AFT 10.  I started her on Aricept,venlafaxine was increased, and aspirin was continued. Evaluation included sedimentation rate, RPR, TSH, B12, and  CPK which were normal. MRI study of the brain 06/07/2008 was felt to be normal. CT scans of the brain 05/06/2007 and  10/12/2008 showed minimal small vessel disease and atrophy. EEG 12/13/2008 showed left-sided intermittent  slowing in the temporal  region. Repeat MRI study 03/04/2009 showed an asymmetry in the left temporal lobe versus the right, being larger, and having T1 signal alterations on the medial aspect and brighter signal in the left mesial temporal lobe on flair. 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 In review of the MRI 06/07/2008. Dr. Derrill Memo at [facility] performed a stereotactic left temporal   biopsy 06/13/2009 with no pathologic diagnosis of  tumor present.  There was astrocytic hypertrophy and there were "hypertensive vascular changes present".  She did not feel "generally well" without documented seizures. She complained of lack of energy.She was taken off of levetiracetam and  started on lamotrigine, progressing to 100 mg, one half twice daily. She states that she is confused at times with difficulty thinking of words. She denies auras of seizures. She occasionally stumbles. She does not notice language problems. Functionally she can cook, shop, clean, do laundry, make the bed and dishes, and mow the yard. She drives. She lives alone She has lost interest in traveling.  She has spells that come over her with ringing of her hands and moving her feet and cannot  be still. These occur while sitting or at nighttime without loss of consciousness  and last 15 minutes. She treats for restless leg symptoms with Aleve.They usually occur when she is going to bed or sitting on the sofa.She occasionally has left-sided headache.  She was  seen by Dr. Yetta Glassman 11/30/2011. Her MRI report 11/29/2011 was not changed in the left hypertrophied temporal lobe according to the patient.she is to see him in two years. She says her memory is okay. She enjoys socializing with a group of friends at [employer] having her breakfast. 06/13/2012=( MMSE 25/30. Clock drawing task 4/4.Animal fluency test 9. Ron Parker  Index of independence in activities of daily 6. Bubba Camp instrumental activities of daily living scale 8. Neuropsychiatric inventory=  Apathy 4. Geriatric depression scale 1/15.Falls assessment tool score 15).   REVIEW OF SYSTEMS: Full 14 system review  of systems performed and notable only for insomnia.  ALLERGIES: No Known Allergies  HOME MEDICATIONS:  Outpatient Prescriptions Prior to Visit  Medication Sig Dispense Refill  . acetaminophen (TYLENOL) 325 MG tablet Take 650 mg by mouth every 6 (six) hours as  needed for pain.      . calcium carbonate (TUMS EX) 750 MG chewable tablet Chew 1 tablet by mouth daily as needed for heartburn.      . Calcium Carbonate-Vitamin D (CALCIUM 600 + D PO) Take 1 tablet by mouth daily.       . diazepam (VALIUM) 2 MG tablet Take 2 mg by mouth every 6 (six) hours as needed for anxiety.      . lamoTRIgine (LAMICTAL) 100 MG tablet Take 150 mg by mouth daily. Whole pill in the a.m. , 1/2 pill at dinner to equal 150mg .      . LORazepam (ATIVAN) 1 MG tablet Take 1 mg by mouth daily as needed (for sleep).      . nadolol (CORGARD) 20 MG tablet Take 20 mg by mouth daily before breakfast.       . OVER THE COUNTER MEDICATION Take 5 mL by mouth at bedtime as needed ("Z-Z-Z-Quil" for sleep).       . venlafaxine XR (EFFEXOR-XR) 75 MG 24 hr capsule Take 75 mg by mouth daily before breakfast.      . rOPINIRole (REQUIP) 0.5 MG tablet TAKE 1 TABLET (0.5 MG TOTAL) BY MOUTH AT BEDTIME.  90 tablet  1  . celecoxib (CELEBREX) 200 MG capsule Take 200 mg by mouth daily as needed (for arthritis pain).       Marland Kitchen HYDROcodone-acetaminophen (NORCO) 10-325 MG per tablet Take 1 tablet by mouth every 6 (six) hours as needed for pain.  20 tablet  0  . Multiple Vitamin (MULTIVITAMIN) capsule Take 1 capsule by mouth daily before breakfast.      . naproxen sodium (ANAPROX) 220 MG tablet Take 440 mg by mouth 2 (two) times daily with a meal.      . omeprazole (PRILOSEC) 20 MG capsule Take 20 mg by mouth daily as needed (usually- only needs 2x's /week).       No facility-administered medications prior to visit.    PAST MEDICAL HISTORY: Past Medical History  Diagnosis Date  . Hypertension   . Dysrhythmia     "my heart skips some beats"  . Anxiety   . Shortness of breath     /w exertion   . Brain tumor (benign) 2010    has consulted /w Dr. Tommi Rumps at [facility], AUG 2013- had MRI  . GERD (gastroesophageal reflux disease)   . H/O hiatal hernia   . Seizures     prior to diagnosed /w brain tumor, on  lamictal since    . Arthritis     L arm, hips & knees - recently had injection in R  knee - Dr. Lorene Dy   . Headache(784.0)     pt. believes the recent headaches are related to "brain tumor", states she has a followup appt. wuith Neurologist in Sept.    . High cholesterol     PAST SURGICAL HISTORY: Past Surgical History  Procedure Laterality Date  . Abdominal hysterectomy  1990s  . Brain biopsy  2010    L temple  . Bunionectomy      left foot  . Breast biopsy Right 12/12/2012    Procedure: Right breast wire guided excision;  Surgeon: Rolm Bookbinder, MD;  Location: [facility]  OR;  Service: General;  Laterality: Right;  . Knee surgery Left 2006    FAMILY HISTORY: Family History  Problem Relation Age of Onset  . Heart disease Mother   . Heart attack Mother   . Cancer Daughter     breast mets to lung  . Diabetes Son   . Colon cancer Father     SOCIAL HISTORY:  History   Social History  . Marital Status: Widowed    Spouse Name: N/A    Number of Children: 2  . Years of Education: College   Occupational History  . Retired    Social History Main Topics  . Smoking status: Never Smoker   . Smokeless tobacco: Never Used  . Alcohol Use: No  . Drug Use: No  . Sexual Activity: Not on file   Other Topics Concern  . Not on file   Social History Narrative   Patient lives at home alone.   Caffeine Use: 1-2 cup of caffeine daily    PHYSICAL EXAM  Filed Vitals:   06/28/13 1356  BP: 172/80  Pulse: 61  Temp: 97.8 F (36.6 C)  Height: 5\' 3"  (1.6 m)  Weight: 165 lb (74.844 kg)    Not recorded    Body mass index is 29.24 kg/(m^2).  GENERAL EXAM: Patient is in no distress  CARDIOVASCULAR: Regular rate and rhythm, no murmurs, no carotid bruits  NEUROLOGIC: MENTAL STATUS: awake, alert, language fluent, comprehension intact, naming intact; MMSE 26/30 (MISSES 1 ON SERIAL 7'S, 1 ON RECALL, 1 ON INSTRUCTIONS, 1 ON PENTAGONS). AFT 8. POSITIVE MYERSONS, BORDERLINE  SNOUT, POSITIVE PALMOMENTAL.  CRANIAL NERVE: pupils equal and reactive to light, visual fields full to confrontation, extraocular muscles intact, no nystagmus, facial sensation and strength symmetric, uvula midline, shoulder shrug symmetric, tongue midline. MILD VOICE TREMOR. MOTOR: normal bulk; MILD COGWHEELING IN LUE WITH CONTRALATERAL REINFORMCEMENT, MILD APRAXIA IN BUE, BRADYKINESIA IN LUE > RUE; LLE > RLE. MILD POSTURAL TREMOR. Full strength in the BUE, BLE. SENSORY: normal and symmetric to light touch COORDINATION: finger-nose-finger, fine finger movements SLOW. REFLEXES: deep tendon reflexes TRACE AND SYMM. GAIT/STATION: narrow based gait; SLOW TO STAND. UNSTEADY GAIT. ANTALGIC. DECR ARM SWING. STOOPED POSTURE.    DIAGNOSTIC DATA (LABS, IMAGING, TESTING) - I reviewed patient records, labs, notes, testing and imaging myself where available.  Lab Results  Component Value Date   WBC 6.8 12/05/2012   HGB 15.5* 12/05/2012   HCT 45.4 12/05/2012   MCV 96.8 12/05/2012   PLT 202 12/05/2012      Component Value Date/Time   NA 138 12/05/2012 1301   K 4.2 12/05/2012 1301   CL 103 12/05/2012 1301   CO2 28 12/05/2012 1301   GLUCOSE 75 12/05/2012 1301   BUN 11 12/05/2012 1301   CREATININE 0.66 12/05/2012 1301   CALCIUM 9.8 12/05/2012 1301   GFRNONAA 79* 12/05/2012 1301   GFRAA >90 12/05/2012 1301   No results found for this basename: CHOL,  HDL,  LDLCALC,  LDLDIRECT,  TRIG,  CHOLHDL   No results found for this basename: HGBA1C   No results found for this basename: VITAMINB12   No results found for this basename: TSH   06/29/12 MRI brain (with and without contrast) demonstrating: 1. Slight asymmetry of the mesial temporal lobes, with the left side slightly larger than the right. 2. Mild perisylvian and right mesial temporal atrophy.  3. Mild periventricular and subcortical non-specific foci of gliosis, likely chronic small vessel ischemic disease. 4. Mucosal thickening and bubbly  secretions in  the ethmoid, frontal and maxillary sinuses. 5. Compared to prior MRI from 03/04/09 there is no significant interval change.  ASSESSMENT AND PLAN  80 y.o. year old female here with history of complex partial seizures, abnormal MRI brain (s/p left temporal lobe biopsy --> astrocytic hypertrophy and hypertensive vascular changes). Not clearly neoplastic. Also with RLS on ropinirole.   Also with chronic memory loss (could be related to mood disorder vs mild neurodegenerative d/o), now with some mild parkinsonian features.    PLAN: - Continue lamotrigine for seizure prevention - Increase ropinirole to 0.5mg  BID for RLS, and now some parkinsonian features - caution with driving and living alone; advised to consider having son live with her and getting home aid as well as transitioning away from driving. She agree with plan.  Return in about 6 months (around 12/29/2013).    Penni Bombard, MD [id number], 0000000 PM Certified in Neurology, Neurophysiology and Neuroimaging  [facility] [address], [place], [place] [address] [phone]

## 2013-06-28 NOTE — Telephone Encounter (Signed)
Pt came in for her visit closing encounter °

## 2013-06-28 NOTE — Patient Instructions (Signed)
Increase ropinirole to 0.5 mg twice a day.  Start using cane/walker.  Caution with driving and living alone. Would be better to transition away from these 2 items.

## 2013-08-13 ENCOUNTER — Other Ambulatory Visit: Payer: Self-pay | Admitting: Neurology

## 2013-08-14 NOTE — Telephone Encounter (Signed)
Former [facility] patient assigned to Dr Leta Baptist.  Dr Erling Cruz was previously prescribing this med.  Would you like to refill or should the patient request from PCP/other provider?  Please advise.  Thank you.

## 2013-09-20 ENCOUNTER — Ambulatory Visit
Admission: RE | Admit: 2013-09-20 | Discharge: 2013-09-20 | Disposition: A | Discharge status: Home / Self Care | Payer: Medicare Other | Source: Ambulatory Visit | Attending: Internal Medicine | Admitting: Internal Medicine

## 2013-09-20 ENCOUNTER — Other Ambulatory Visit: Payer: Self-pay | Admitting: Internal Medicine

## 2013-09-20 DIAGNOSIS — R079 Chest pain, unspecified: Secondary | ICD-10-CM

## 2013-10-10 ENCOUNTER — Other Ambulatory Visit: Payer: Self-pay

## 2013-10-10 MED ORDER — LAMOTRIGINE 100 MG PO TABS: ORAL_TABLET | ORAL | Status: DC

## 2013-12-27 ENCOUNTER — Ambulatory Visit (INDEPENDENT_AMBULATORY_CARE_PROVIDER_SITE_OTHER): Payer: Medicare Other | Admitting: Diagnostic Neuroimaging

## 2013-12-27 ENCOUNTER — Encounter: Payer: Self-pay | Admitting: Diagnostic Neuroimaging

## 2013-12-27 ENCOUNTER — Encounter (INDEPENDENT_AMBULATORY_CARE_PROVIDER_SITE_OTHER): Payer: Self-pay

## 2013-12-27 VITALS — BP 151/78 | HR 63 | Temp 97.5°F | Ht 63.0 in | Wt 161.2 lb

## 2013-12-27 DIAGNOSIS — G40209 Localization-related (focal) (partial) symptomatic epilepsy and epileptic syndromes with complex partial seizures, not intractable, without status epilepticus: Secondary | ICD-10-CM

## 2013-12-27 DIAGNOSIS — G20 Parkinson's disease: Secondary | ICD-10-CM

## 2013-12-27 DIAGNOSIS — G2581 Restless legs syndrome: Secondary | ICD-10-CM

## 2013-12-27 MED ORDER — ROPINIROLE HCL 1 MG PO TABS: 1.0000 mg | ORAL_TABLET | Freq: Two times a day (BID) | ORAL | Status: DC

## 2013-12-27 NOTE — Patient Instructions (Signed)
Increase ropinirole to 1mg  twice a day.

## 2013-12-27 NOTE — Progress Notes (Signed)
[facility]  PATIENT: Carrie Short DOB: Dec 21, 1927  REFERRING CLINICIAN:  HISTORY FROM: patient REASON FOR VISIT: follow up   HISTORICAL  CHIEF COMPLAINT:  Chief Complaint  Patient presents with  . Follow-up    HISTORY OF PRESENT ILLNESS:   UPDATE 12/27/13: Since last visit, more tremor, more gait diff. No seizures. Memory loss slightly worse. Still lives at home. Son helps her with yard work. She fell a few months ago, and had right wrist fracture.   UPDATE 06/28/13: Since last visit, more knee/hip/back pain. Restless legs slightly worse. Now with tremor (with handwriting). More balance difficulty. Also with some shortness of breath and emphysematous changes on lung testing.  UPDATE 12/26/12: Since last visit, stable. Some intermittent HA, left sided. Some intermittent left facial numbness. Some memory loss. Got lost driving to this appt this AM, and had to call her daughter for directions.  Still lives alone (with dog). Daughter and son call daily to check on her.  PRIOR HPI (06/13/12, Dr. Erling Cruz):  80 year old right-handed white widowed female with a 3-1/2 year history of hesitancy in speech, using neologisms and making inappropriate comments, at times associated with a blank stare. I saw her 12/08/2008 with normal neurologic examination, MMSE 25/30, CDT 4/4, AFT 10.  I started her on Aricept,venlafaxine was increased, and aspirin was continued. Evaluation included sedimentation rate, RPR, TSH, B12, and  CPK which were normal. MRI study of the brain 06/07/2008 was felt to be normal. CT scans of the brain 05/06/2007 and  10/12/2008 showed minimal small vessel disease and atrophy. EEG 12/13/2008 showed left-sided intermittent  slowing in the temporal  region. Repeat MRI study 03/04/2009 showed an asymmetry in the left temporal lobe versus the right, being larger, and having T1 signal alterations on the medial aspect and brighter signal in the left mesial temporal lobe on flair.  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 In review of the MRI 06/07/2008. Dr. Derrill Memo at [facility] performed a stereotactic left temporal  biopsy 06/13/2009 with no pathologic diagnosis of  tumor present.  There was astrocytic hypertrophy and there were "hypertensive vascular changes present".  She did not feel "generally well" without documented seizures. She complained of lack of energy.She was taken off of levetiracetam and  started on lamotrigine, progressing to 100 mg, one half twice daily. She states that she is confused at times with difficulty thinking of words. She denies auras of seizures. She occasionally stumbles. She does not notice language problems. Functionally she can cook, shop, clean, do laundry, make the bed and dishes, and mow the yard. She drives. She lives alone She has lost interest in traveling.  She has spells that come over her with ringing of her hands and moving her feet and cannot  be still. These occur while sitting or at nighttime without loss of consciousness  and last 15 minutes. She treats for restless leg symptoms with Aleve.They usually occur when she is going to bed or sitting on the sofa.She occasionally has left-sided headache.  She was  seen by Dr. Yetta Glassman 11/30/2011. Her MRI report 11/29/2011 was not changed in the left hypertrophied temporal lobe according to the patient.she is to see him in two years. She says her memory is okay. She enjoys socializing with a group of friends at [employer] having her breakfast. 06/13/2012=( MMSE 25/30. Clock drawing task 4/4.Animal fluency test 9. Ron Parker  Index of independence in activities  of daily 6. Bubba Camp instrumental activities of daily living scale 8. Neuropsychiatric inventory=  Apathy 4. Geriatric depression scale 1/15.Falls assessment tool score 15).   REVIEW OF SYSTEMS: Full 14 system review of systems performed and notable only for eye itching RLS gait  diff daytime sleepiness.   ALLERGIES: No Known Allergies  HOME MEDICATIONS:  Outpatient Prescriptions Prior to Visit  Medication Sig Dispense Refill  . acetaminophen (TYLENOL) 325 MG tablet Take 650 mg by mouth every 6 (six) hours as needed for pain.      . calcium carbonate (TUMS EX) 750 MG chewable tablet Chew 1 tablet by mouth daily as needed for heartburn.      . Calcium Carbonate-Vitamin D (CALCIUM 600 + D PO) Take 1 tablet by mouth daily.       Marland Kitchen lamoTRIgine (LAMICTAL) 100 MG tablet One full tab in the a.m., one half tab at night (150mg  total daily dose)  135 tablet  2  . LORazepam (ATIVAN) 1 MG tablet Take 1 mg by mouth daily as needed (for sleep).      Marland Kitchen OVER THE COUNTER MEDICATION Take 5 mL by mouth at bedtime as needed ("Z-Z-Z-Quil" for sleep).       . venlafaxine XR (EFFEXOR-XR) 75 MG 24 hr capsule Take 75 mg by mouth daily before breakfast.      . rOPINIRole (REQUIP) 0.5 MG tablet Take 1 tablet (0.5 mg total) by mouth 2 (two) times daily.  180 tablet  4  . diazepam (VALIUM) 2 MG tablet Take 2 mg by mouth every 6 (six) hours as needed for anxiety.      . nadolol (CORGARD) 20 MG tablet Take 20 mg by mouth daily before breakfast.       . venlafaxine XR (EFFEXOR-XR) 75 MG 24 hr capsule TAKE 1 CAPSULE (75 MG TOTAL) BY MOUTH DAILY.  90 capsule  3   No facility-administered medications prior to visit.    PAST MEDICAL HISTORY: Past Medical History  Diagnosis Date  . Hypertension   . Dysrhythmia     "my heart skips some beats"  . Anxiety   . Shortness of breath     /w exertion   . Brain tumor (benign) 2010    has consulted /w Dr. Tommi Rumps at [facility], AUG 2013- had MRI  . GERD (gastroesophageal reflux disease)   . H/O hiatal hernia   . Seizures     prior to diagnosed /w brain tumor, on lamictal since    . Arthritis     L arm, hips & knees - recently had injection in R  knee - Dr. Lorene Dy   . Headache(784.0)     pt. believes the recent headaches are related to "brain  tumor", states she has a followup appt. wuith Neurologist in Sept.    . High cholesterol     PAST SURGICAL HISTORY: Past Surgical History  Procedure Laterality Date  . Abdominal hysterectomy  1990s  . Brain biopsy  2010    L temple  . Bunionectomy      left foot  . Breast biopsy Right 12/12/2012    Procedure: Right breast wire guided excision;  Surgeon: Rolm Bookbinder, MD;  Location: [facility] OR;  Service: General;  Laterality: Right;  . Knee surgery Left 2006    FAMILY HISTORY: Family History  Problem Relation Age of Onset  . Heart disease Mother   . Heart attack Mother   . Cancer Daughter     breast mets to lung  .  Diabetes Son   . Colon cancer Father     SOCIAL HISTORY:  History   Social History  . Marital Status: Widowed    Spouse Name: N/A    Number of Children: 2  . Years of Education: College   Occupational History  . Retired    Social History Main Topics  . Smoking status: Never Smoker   . Smokeless tobacco: Never Used  . Alcohol Use: No  . Drug Use: No  . Sexual Activity: Not on file   Other Topics Concern  . Not on file   Social History Narrative   Patient lives at home alone.   Caffeine Use: 1-2 cup of caffeine daily    PHYSICAL EXAM  Filed Vitals:   12/27/13 1452  BP: 151/78  Pulse: 63  Temp: 97.5 F (36.4 C)  TempSrc: Oral  Height: 5\' 3"  (1.6 m)  Weight: 161 lb 3.2 oz (73.12 kg)    Not recorded    Body mass index is 28.56 kg/(m^2).  GENERAL EXAM: Patient is in no distress  CARDIOVASCULAR: Regular rate and rhythm, no murmurs, no carotid bruits  NEUROLOGIC: MENTAL STATUS: awake, alert, language fluent, comprehension intact, naming intact; POSITIVE MYERSONS, BORDERLINE SNOUT, POSITIVE PALMOMENTAL.  CRANIAL NERVE: pupils equal and reactive to light, visual fields full to confrontation, extraocular muscles intact, no nystagmus, facial sensation and strength symmetric, uvula midline, shoulder shrug symmetric, tongue midline. MILD  VOICE TREMOR. MOTOR: normal bulk; MILD COGWHEELING IN LUE WITH CONTRALATERAL REINFORMCEMENT, MILD APRAXIA IN BUE, BRADYKINESIA IN LUE > RUE; LLE > RLE. MILD POSTURAL TREMOR. Full strength in the BUE, BLE. SENSORY: normal and symmetric to light touch COORDINATION: finger-nose-finger, fine finger movements SLOW. REFLEXES: deep tendon reflexes TRACE AND SYMM. GAIT/STATION: narrow based gait; SLOW TO STAND. UNSTEADY GAIT. ANTALGIC. DECR ARM SWING. STOOPED POSTURE.    DIAGNOSTIC DATA (LABS, IMAGING, TESTING) - I reviewed patient records, labs, notes, testing and imaging myself where available.  Lab Results  Component Value Date   WBC 6.8 12/05/2012   HGB 15.5* 12/05/2012   HCT 45.4 12/05/2012   MCV 96.8 12/05/2012   PLT 202 12/05/2012      Component Value Date/Time   NA 138 12/05/2012 1301   K 4.2 12/05/2012 1301   CL 103 12/05/2012 1301   CO2 28 12/05/2012 1301   GLUCOSE 75 12/05/2012 1301   BUN 11 12/05/2012 1301   CREATININE 0.66 12/05/2012 1301   CALCIUM 9.8 12/05/2012 1301   GFRNONAA 79* 12/05/2012 1301   GFRAA >90 12/05/2012 1301   No results found for this basename: CHOL,  HDL,  LDLCALC,  LDLDIRECT,  TRIG,  CHOLHDL   No results found for this basename: HGBA1C   No results found for this basename: VITAMINB12   No results found for this basename: TSH    06/29/12 MRI brain (with and without contrast) demonstrating: 1. Slight asymmetry of the mesial temporal lobes, with the left side slightly larger than the right. 2. Mild perisylvian and right mesial temporal atrophy.  3. Mild periventricular and subcortical non-specific foci of gliosis, likely chronic small vessel ischemic disease. 4. Mucosal thickening and bubbly secretions in the ethmoid, frontal and maxillary sinuses. 5. Compared to prior MRI from 03/04/09 there is no significant interval change.   ASSESSMENT AND PLAN  80 y.o. year old female here with history of complex partial seizures, abnormal MRI brain (s/p left temporal  lobe biopsy --> astrocytic hypertrophy and hypertensive vascular changes). Not clearly neoplastic. Also with RLS on ropinirole.  Also with chronic memory loss (could be related to mood disorder vs mild neurodegenerative d/o), now with some mild parkinsonian features.    PLAN: - Continue lamotrigine for seizure prevention - Increase ropinirole to 1mg  BID for RLS + parkinsonian features - PT evaluation - caution with driving and living alone; advised to consider having son live with her and getting home aid as well as transitioning away from driving  Return in about 6 months (around 06/27/2014).    Penni Bombard, MD 07/27/1854, 3:14 PM Certified in Neurology, Neurophysiology and Neuroimaging  [facility] [address] [place], [place] [address] [phone]

## 2014-02-13 ENCOUNTER — Ambulatory Visit: Payer: Medicare Other | Admitting: Rehabilitative and Restorative Service Providers"

## 2014-02-27 ENCOUNTER — Ambulatory Visit: Payer: Medicare Other | Attending: Diagnostic Neuroimaging | Admitting: Physical Therapy

## 2014-02-27 ENCOUNTER — Encounter: Payer: Self-pay | Admitting: Physical Therapy

## 2014-02-27 DIAGNOSIS — R269 Unspecified abnormalities of gait and mobility: Secondary | ICD-10-CM | POA: Insufficient documentation

## 2014-02-27 DIAGNOSIS — Z5189 Encounter for other specified aftercare: Secondary | ICD-10-CM | POA: Diagnosis present

## 2014-02-27 DIAGNOSIS — R6889 Other general symptoms and signs: Secondary | ICD-10-CM | POA: Insufficient documentation

## 2014-02-27 DIAGNOSIS — G20 Parkinson's disease: Secondary | ICD-10-CM | POA: Diagnosis not present

## 2014-02-27 DIAGNOSIS — R279 Unspecified lack of coordination: Secondary | ICD-10-CM

## 2014-02-28 ENCOUNTER — Encounter: Payer: Self-pay | Admitting: Physical Therapy

## 2014-02-28 NOTE — Therapy (Signed)
Physical Therapy Evaluation  Patient Details  Name: Carrie Short MRN: [id number] Date of Birth: April 04, 1928  Encounter Date: 02/27/2014      PT End of Session - 02/27/14 1648    Visit Number 1   Number of Visits 17   Date for PT Re-Evaluation 03/27/14   PT Start Time 1446   PT Stop Time 1532   PT Time Calculation (min) 46 min   Equipment Utilized During Treatment Gait belt   Activity Tolerance Patient tolerated treatment well   Behavior During Therapy [facility] for tasks assessed/performed      Past Medical History  Diagnosis Date  . Hypertension   . Dysrhythmia     "my heart skips some beats"  . Anxiety   . Shortness of breath     /w exertion   . Brain tumor (benign) 2010    has consulted /w Dr. Tommi Rumps at [facility], AUG 2013- had MRI  . GERD (gastroesophageal reflux disease)   . H/O hiatal hernia   . Seizures     prior to diagnosed /w brain tumor, on lamictal since    . Arthritis     L arm, hips & knees - recently had injection in R  knee - Dr. Lorene Dy   . Headache(784.0)     pt. believes the recent headaches are related to "brain tumor", states she has a followup appt. wuith Neurologist in Sept.    . High cholesterol     Past Surgical History  Procedure Laterality Date  . Abdominal hysterectomy  1990s  . Brain biopsy  2010    L temple  . Bunionectomy      left foot  . Breast biopsy Right 12/12/2012    Procedure: Right breast wire guided excision;  Surgeon: Rolm Bookbinder, MD;  Location: [place];  Service: General;  Laterality: Right;  . Knee surgery Left 2006    There were no vitals taken for this visit.  Visit Diagnosis:  Abnormality of gait - Plan: PT plan of care cert/re-cert  Lack of coordination - Plan: PT plan of care cert/re-cert  Activity intolerance - Plan: PT plan of care cert/re-cert      Subjective Assessment - 02/27/14 1502    Symptoms Balance is not good.   Patient Stated Goals To balance better. "I'm afraid I'll fall."   Currently in  Pain? Yes   Pain Score 7    Pain Location Hip   Pain Orientation Right;Left   Pain Type Chronic pain   Pain Onset More than a month ago   Aggravating Factors  sleeping on side causes the pain. Due to chronic nature & outside scope of referral, PT will monitor with activities.   Pain Relieving Factors Getting up to move around.   Effect of Pain on Daily Activities awakens earlier than wants.   Multiple Pain Sites No          [facility] PT Assessment - 02/27/14 1446    Assessment   Medical Diagnosis Parkinsonism   Onset Date 06/13/12   Prior Therapy no   Precautions   Precautions Fall   Restrictions   Weight Bearing Restrictions No   Home Environment   Living Enviornment Private residence   Living Arrangements Alone   Available Help at Discharge Family;Available PRN/intermittently   Type of Home House   Home Access Level entry   Home Layout One level;Other (Comment)  basement for storage only.   [place] - single point   Prior Function  Level of Independence Independent with basic ADLs;Independent with gait   Vocation Retired   Observation/Other Assessments   Observations Minor tremors in right hand while resting.   Focus on Therapeutic Outcomes (FOTO)  36  functional status score   Neuro Quality of Life  31.1   Sensation   Light Touch Appears Intact   Posture/Postural Control   Posture/Postural Control Postural limitations   Postural Limitations Rounded Shoulders;Forward head;Flexed trunk   PROM   Overall PROM Comments ROM WNL for Knee Extension and Ankle Dorsiflexion.   Strength   Overall Strength Within functional limits for tasks performed   Overall Strength Comments Tested in sitting. Hip flexion (R) 4/5; (L) 4+/5   Ambulation/Gait   Ambulation/Gait Yes   Ambulation/Gait Assistance 5: Supervision   Assistive device None   Gait Pattern Step-through pattern;Decreased stride length  Minor Left compensated trendelenberg, has inconsistent gait   Gait  velocity 1.80 ft/sec   Stairs No   Door Management Not tested (comment)   Ramp Not tested (comment)   Curb Not tested (comment)   Berg Balance Test   Sit to Stand Able to stand  independently using hands   Standing Unsupported Able to stand safely 2 minutes   Sitting with Back Unsupported but Feet Supported on Floor or Stool Able to sit safely and securely 2 minutes   Stand to Sit Sits safely with minimal use of hands   Transfers Able to transfer safely, minor use of hands   Standing Unsupported with Eyes Closed Able to stand 10 seconds with supervision   Standing Ubsupported with Feet Together Needs help to attain position and unable to hold for 15 seconds   From Standing, Reach Forward with Outstretched Arm Can reach forward >12 cm safely (5")   From Standing Position, Pick up Object from Floor Able to pick up shoe, needs supervision   From Standing Position, Turn to Look Behind Over each Shoulder Looks behind from both sides and weight shifts well   Turn 360 Degrees Needs close supervision or verbal cueing   Standing Unsupported, Alternately Place Feet on Step/Stool Able to complete >2 steps/needs minimal assist   Standing Unsupported, One Foot in Front Needs help to step but can hold 15 seconds   Standing on One Leg Tries to lift leg/unable to hold 3 seconds but remains standing independently   Timed Up and Go Test   Normal TUG (seconds) 21.39   TUG Comments safe during turns but very slow. no assistive device used            PT Education - 02/27/14 1647    Education provided Yes   Education Details Role of PT, implications of balance test scores   Person(s) Educated Patient   Methods Explanation   Comprehension Verbalized understanding          PT Short Term Goals - 02/28/14 0956    PT SHORT TERM GOAL #1   Title Perform PWR exercises in multiple positions (sitting, standing, prone, supine) with minimal cueing for accuracy (Target date 03/29/14)   Time 4   Period  Weeks   Status New   PT SHORT TERM GOAL #2   Title Demonstrate turning 180 degress taking >4 steps to complete safely (target date 03/29/14)   Time 4   Period Weeks   Status New   PT SHORT TERM GOAL #3   Title Improve TUG score to <17 seconds  (Target date 03/29/14)   Time 4   Period Weeks  Status New   PT SHORT TERM GOAL #4   Title Improve BERG balance score to >42/56  (Target date 03/29/14)   Time 4   Period Weeks   Status New          PT Long Term Goals - 24-Mar-2014 1002    PT LONG TERM GOAL #1   Title Verbalize/demonstrate understanding of fall prevention strategies within the home (Target date 04/27/14)   Time 8   Period Weeks   Status New   PT LONG TERM GOAL #2   Title Perform HEP consisting of PWR! exercise flow progression independently (Target date 04/27/14)   Time 8   Period Weeks   Status New   PT LONG TERM GOAL #3   Title Improve TUG score to <13.5 seconds (Target date 04/27/14)   Time 8   Period Weeks   Status New   PT LONG TERM GOAL #4   Title Improve BERG balance score to >48/56 (Target date 04/27/14)   Time 8   Period Weeks   Status New   PT LONG TERM GOAL #5   Title Verbalize understanding of local Parkinson's community resources available   Time 8   Period Weeks   Status New   Additional Long Term Goals   Additional Long Term Goals Yes   PT LONG TERM GOAL #6   Title Increase FOTO score to >10 points          Plan - 02/27/14 1653    Clinical Impression Statement Patient was recently diagnosed with Parkinson's disease and neurologist referred her for PT evaluation for mobility along with fact that her gait is unstable & she has fallen recently.  Patient is limited in safe mobility by balance and ambulation deficits and will benefit from skilled therapy services to decrease deficits and increase safety with mobility.   Pt will benefit from skilled therapeutic intervention in order to improve on the following deficits Abnormal gait;Difficulty  walking;Postural dysfunction;Decreased endurance;Decreased activity tolerance;Decreased balance;Decreased mobility;Decreased knowledge of use of DME   Rehab Potential Good   Clinical Impairments Affecting Rehab Potential Patient has postural, balance, and ambulation deficits.    PT Frequency 2x / week   PT Duration 8 weeks   PT Treatment/Interventions Patient/family education;Therapeutic activities;Therapeutic exercise;Gait training;Balance training;Neuromuscular re-education;Energy conservation;ADLs/Self Care Home Management;Stair training;Functional mobility training;DME Instruction   PT Next Visit Plan Introduce PWR!moves exercises to address balance, strength.   Consulted and Agree with Plan of Care Patient          G-Codes - March 24, 2014 Aug 13, 1227    Functional Assessment Tool Used Merrilee Jansky Balance 36/56   Functional Limitation Mobility: Walking and moving around   Mobility: Walking and Moving Around Current Status [phone]) At least 40 percent but less than 60 percent impaired, limited or restricted   Mobility: Walking and Moving Around Goal Status [phone]) At least 20 percent but less than 40 percent impaired, limited or restricted      Problem List Patient Active Problem List   Diagnosis Date Noted  . Restless leg syndrome 06/28/2013  . MRI of brain abnormal 12/26/2012  . Complex partial seizures 12/26/2012  . Memory loss 12/26/2012   This entire session of physical therapy was performed under the direct supervision of PT signing evaluation /treatment.  Blima Rich, Student PT  Jamey Reas 24-Mar-2014, 1:17 PM

## 2014-03-06 ENCOUNTER — Ambulatory Visit: Payer: Medicare Other | Admitting: Physical Therapy

## 2014-03-14 ENCOUNTER — Ambulatory Visit: Payer: Medicare Other | Admitting: Physical Therapy

## 2014-03-14 ENCOUNTER — Telehealth: Payer: Self-pay | Admitting: *Deleted

## 2014-03-14 ENCOUNTER — Encounter: Payer: Self-pay | Admitting: Physical Therapy

## 2014-03-14 DIAGNOSIS — Z5189 Encounter for other specified aftercare: Secondary | ICD-10-CM | POA: Diagnosis not present

## 2014-03-14 DIAGNOSIS — R279 Unspecified lack of coordination: Secondary | ICD-10-CM

## 2014-03-14 DIAGNOSIS — R6889 Other general symptoms and signs: Secondary | ICD-10-CM

## 2014-03-14 DIAGNOSIS — R269 Unspecified abnormalities of gait and mobility: Secondary | ICD-10-CM

## 2014-03-14 NOTE — Patient Instructions (Signed)
Sit to Stand Transfers:  1. Scoot out to the edge of the chair 2. Place your feet flat on the floor, shoulder width apart.  Make sure your feet are tucked just under your knees. 3. Lean forward (nose over toes) with momentum, and stand up tall with your best posture.  If you need to use your arms, use them as a quick boost up to stand. 4. If you are in a low or soft chair, you can lean back and then forward up to stand, in order to get more momentum. 5. Once you are standing, make sure you are looking ahead and standing tall.  To sit down:  1. Back up until you feel the chair behind your legs. 2. Bend at you hips, reaching  Back for you chair, if needed, then slowly squat to sit down on your chair. 

## 2014-03-14 NOTE — Therapy (Signed)
Physical Therapy Treatment  Patient Details  Name: Carrie Short MRN: [id number] Date of Birth: 1927/09/01  Encounter Date: 03/14/2014      PT End of Session - 03/14/14 1545    Visit Number 2  G2   Number of Visits 17   Date for PT Re-Evaluation 03/27/14   PT Start Time 3785   PT Stop Time 1532   PT Time Calculation (min) 39 min   Activity Tolerance Patient tolerated treatment well      Past Medical History  Diagnosis Date  . Hypertension   . Dysrhythmia     "my heart skips some beats"  . Anxiety   . Shortness of breath     /w exertion   . Brain tumor (benign) 2010    has consulted /w Dr. Tommi Rumps at [facility], AUG 2013- had MRI  . GERD (gastroesophageal reflux disease)   . H/O hiatal hernia   . Seizures     prior to diagnosed /w brain tumor, on lamictal since    . Arthritis     L arm, hips & knees - recently had injection in R  knee - Dr. Lorene Dy   . Headache(784.0)     pt. believes the recent headaches are related to "brain tumor", states she has a followup appt. wuith Neurologist in Sept.    . High cholesterol     Past Surgical History  Procedure Laterality Date  . Abdominal hysterectomy  1990s  . Brain biopsy  2010    L temple  . Bunionectomy      left foot  . Breast biopsy Right 12/12/2012    Procedure: Right breast wire guided excision;  Surgeon: Rolm Bookbinder, MD;  Location: [place];  Service: General;  Laterality: Right;  . Knee surgery Left 2006    There were no vitals taken for this visit.  Visit Diagnosis:  Abnormality of gait  Lack of coordination  Activity intolerance      Subjective Assessment - 03/14/14 1456    Symptoms Do I see you for a handicap sticker for my car?  Maybe that's the doctor.  Pt reports missing taking her medication today-"even my teeth are shaking"   Currently in Pain? No/denies            [facility] Adult PT Treatment/Exercise - 03/14/14 1458    Transfers   Transfers Sit to Stand;Stand to Sit  Cues for  scooting, increased forward lean and momentum   Sit to Stand 7: Independent;Without upper extremity assist  from 20 inch mat, 18 inch chair 5 reps each   Stand to Sit 7: Independent   Ambulation/Gait   Ambulation/Gait Yes  Instruction provided for trial of gait with cane   Ambulation/Gait Assistance 4: Min guard  Pt has difficulty with fluid sequencing of cane    Ambulation Distance (Feet) 200 Feet   Assistive device Straight cane  Trial of cane, with verbal and tactile cues for sequence   Gait Pattern Step-through pattern;Decreased stride length          PT Education - 03/14/14 1545    Education provided Yes   Education Details HEP for initiation of PWR! Moves exercises-modified with UE support as needed at chair or counter; transfer training   Person(s) Educated Patient   Methods Explanation;Demonstration;Handout   Comprehension Verbalized understanding              Plan - 03/14/14 1547    Clinical Impression Statement Initial HEP provided with PWR! Moves in  standing for improved posture, weightshifting, trunk rotation and step strategy for balance.  Pt requires occasional UE support for balance.  Pt performs sit<>stand transfers well with verbal cues for technique and improved momentum.  Trialed cane with gait, with pt having difficulty sequencing gait with cane with increased forward head looking down at floor.  Pt may benefit from some type of assistive device for additional stability/confidence with gait.   Pt will benefit from skilled therapeutic intervention in order to improve on the following deficits Abnormal gait;Difficulty walking;Postural dysfunction;Decreased endurance;Decreased activity tolerance;Decreased balance;Decreased mobility;Decreased knowledge of use of DME   Rehab Potential Good   PT Frequency 2x / week   PT Duration 8 weeks   PT Treatment/Interventions Patient/family education;Therapeutic activities;Therapeutic exercise;Gait training;Balance  training;Neuromuscular re-education;Energy conservation;ADLs/Self Care Home Management;Stair training;Functional mobility training;DME Instruction   PT Next Visit Plan review PWR! Moves in standing, add forward and backward step and weightshifting, seated PWR! MOves, counter balance activities; gait with cane trial again   Consulted and Agree with Plan of Care Patient        Problem List Patient Active Problem List   Diagnosis Date Noted  . Restless leg syndrome 06/28/2013  . MRI of brain abnormal 12/26/2012  . Complex partial seizures 12/26/2012  . Memory loss 12/26/2012                            PWR ([facility]) - 03/14/14 1500    PWR! exercises Moves in standing  Pt requires visual, tactile, and verbal cues   PWR! Up 10  with chair in front   PWR! Rock 10   PWR! Twist 10   PWR! Twist Step side 10 reps at counter  Step back and weightshift 10 reps at counter                          MARRIOTT,AMY W. 03/14/2014, 3:54 PM     Mady Haagensen, PT 03/14/2014 3:54 PM Phone: [phone] Fax: [phone]

## 2014-03-19 ENCOUNTER — Ambulatory Visit: Payer: Medicare Other | Admitting: Physical Therapy

## 2014-03-19 ENCOUNTER — Encounter: Payer: Self-pay | Admitting: Physical Therapy

## 2014-03-19 DIAGNOSIS — R279 Unspecified lack of coordination: Secondary | ICD-10-CM

## 2014-03-19 DIAGNOSIS — Z5189 Encounter for other specified aftercare: Secondary | ICD-10-CM | POA: Diagnosis not present

## 2014-03-19 DIAGNOSIS — R269 Unspecified abnormalities of gait and mobility: Secondary | ICD-10-CM

## 2014-03-19 DIAGNOSIS — R6889 Other general symptoms and signs: Secondary | ICD-10-CM

## 2014-03-19 NOTE — Therapy (Signed)
Physical Therapy Treatment  Patient Details  Name: Carrie Short MRN: [id number] Date of Birth: Jun 08, 1927  Encounter Date: 03/19/2014      PT End of Session - 03/19/14 1151    Visit Number 3  G3   Number of Visits 17   PT Start Time 1105   PT Stop Time 1143   PT Time Calculation (min) 38 min   Activity Tolerance Patient tolerated treatment well      Past Medical History  Diagnosis Date  . Hypertension   . Dysrhythmia     "my heart skips some beats"  . Anxiety   . Shortness of breath     /w exertion   . Brain tumor (benign) 2010    has consulted /w Dr. Tommi Rumps at [facility] AUG 2013- had MRI  . GERD (gastroesophageal reflux disease)   . H/O hiatal hernia   . Seizures     prior to diagnosed /w brain tumor, on lamictal since    . Arthritis     L arm, hips & knees - recently had injection in R  knee - Dr. Lorene Dy   . Headache(784.0)     pt. believes the recent headaches are related to "brain tumor", states she has a followup appt. wuith Neurologist in Sept.    . High cholesterol     Past Surgical History  Procedure Laterality Date  . Abdominal hysterectomy  1990s  . Brain biopsy  2010    L temple  . Bunionectomy      left foot  . Breast biopsy Right 12/12/2012    Procedure: Right breast wire guided excision;  Surgeon: Rolm Bookbinder, MD;  Location: [place];  Service: General;  Laterality: Right;  . Knee surgery Left 2006    There were no vitals taken for this visit.  Visit Diagnosis:  Abnormality of gait  Lack of coordination  Activity intolerance      Subjective Assessment - 03/19/14 1107    Symptoms No new complaints today. Haven't done exercises yet, as I was busy with Thanksgiving.   Currently in Pain? Yes   Pain Score 7    Pain Location Arm   Pain Orientation Left   Pain Type Chronic pain   Pain Onset More than a month ago   Pain Frequency Occasional   Aggravating Factors  Pain worse when going to bed   Pain Relieving Factors keeping arm  warm helps pain            [facility] Adult PT Treatment/Exercise - 03/19/14 1110    Transfers   Transfers Sit to Stand   Sit to Stand 7: Independent   Stand to Sit 7: Independent;Without upper extremity assist  5 reps from 10 inch mat; 5 reps from chair   High Level Balance   High Level Balance Activities Side stepping;Backward walking  Fwd/back walk,March in place; heel/toe raises, alt step taps   High Level Balance Comments Requires UE support, cues to widen BOS  Performed at counter, 3 reps x 10 feet          PT Education - 03/19/14 1148    Education provided Yes   Education Details Reviewed HEP for PWR! Moves in standing; added step and weight shift in backwards direction; discussed purpose of exercises for balance retraining   Person(s) Educated Patient   Methods Explanation;Demonstration;Handout  Pt unsure where handout from HEP is from last week; provided second copy   Comprehension Verbalized understanding;Verbal cues required;Need further instruction  Plan - 03/19/14 1151    Clinical Impression Statement Pt requires frequent verbal and tactile cues for proper technique with HEP performance; pt feels that she is very off-balance during activities.  Pt has particular difficulty with weightshifting in posterior directions.  Dynmaic balance activities performed at counter, but not added to HEP at this time.   Pt will benefit from skilled therapeutic intervention in order to improve on the following deficits Abnormal gait;Difficulty walking;Postural dysfunction;Decreased endurance;Decreased activity tolerance;Decreased balance;Decreased mobility;Decreased knowledge of use of DME   Rehab Potential Good   PT Frequency 2x / week   PT Duration 8 weeks   PT Treatment/Interventions Patient/family education;Therapeutic activities;Therapeutic exercise;Gait training;Balance training;Neuromuscular re-education;Energy conservation;ADLs/Self Care Home Management;Stair  training;Functional mobility training;DME Instruction   PT Next Visit Plan Review PWR! Moves in standing, multidirectional step and weightshifting; counter balance activities to address dynamic balance and balance strategies; gait with cane   Consulted and Agree with Plan of Care Patient        Problem List Patient Active Problem List   Diagnosis Date Noted  . Restless leg syndrome 06/28/2013  . MRI of brain abnormal 12/26/2012  . Complex partial seizures 12/26/2012  . Memory loss 12/26/2012                            PWR [facility]) - 03/19/14 1118    PWR! exercises Moves in standing  for posture, trunk flexibility and dynamic balance   PWR! Up 10  with verbal and visual cues   PWR! Rock 10   PWR! Twist 10  with cues for scapular squeezes/deliberate movements   PWR Step Step:  Side x 10 reps, then back x 10 reps at counter; step forward x 10 reps   Pt requires UE support and verbal/visual cues   Comments Reviewed from HEP given last visit                          Mashelle Busick W. 03/19/2014, 12:00 PM     Mady Haagensen, PT 03/19/2014 12:00 PM Phone: [phone] Fax: [phone]

## 2014-03-20 ENCOUNTER — Encounter: Payer: Self-pay | Admitting: Physical Therapy

## 2014-03-20 ENCOUNTER — Ambulatory Visit: Payer: Medicare Other | Attending: Diagnostic Neuroimaging | Admitting: Physical Therapy

## 2014-03-20 DIAGNOSIS — R269 Unspecified abnormalities of gait and mobility: Secondary | ICD-10-CM | POA: Insufficient documentation

## 2014-03-20 DIAGNOSIS — G20 Parkinson's disease: Secondary | ICD-10-CM | POA: Insufficient documentation

## 2014-03-20 DIAGNOSIS — Z5189 Encounter for other specified aftercare: Secondary | ICD-10-CM | POA: Insufficient documentation

## 2014-03-20 DIAGNOSIS — R6889 Other general symptoms and signs: Secondary | ICD-10-CM | POA: Insufficient documentation

## 2014-03-20 DIAGNOSIS — R279 Unspecified lack of coordination: Secondary | ICD-10-CM

## 2014-03-20 NOTE — Therapy (Signed)
Physical Therapy Treatment  Patient Details  Name: Carrie Short MRN: [id number] Date of Birth: [birth year]  Encounter Date: 03/20/2014      PT End of Session - 03/20/14 1408    Visit Number 4  G4   Number of Visits 17   Date for PT Re-Evaluation 04/27/13  per POC 8 weeks from evaluation   PT Start Time 1327  Pt arrives late   PT Stop Time 1401   PT Time Calculation (min) 34 min   Equipment Utilized During Treatment Gait belt   Activity Tolerance Patient tolerated treatment well      Past Medical History  Diagnosis Date  . Hypertension   . Dysrhythmia     "my heart skips some beats"  . Anxiety   . Shortness of breath     /w exertion   . Brain tumor (benign) 2010    has consulted /w Dr. Tommi Rumps at [facility], AUG 2013- had MRI  . GERD (gastroesophageal reflux disease)   . H/O hiatal hernia   . Seizures     prior to diagnosed /w brain tumor, on lamictal since    . Arthritis     L arm, hips & knees - recently had injection in R  knee - Dr. Lorene Dy   . Headache(784.0)     pt. believes the recent headaches are related to "brain tumor", states she has a followup appt. wuith Neurologist in Sept.    . High cholesterol     Past Surgical History  Procedure Laterality Date  . Abdominal hysterectomy  1990s  . Brain biopsy  2010    L temple  . Bunionectomy      left foot  . Breast biopsy Right 12/12/2012    Procedure: Right breast wire guided excision;  Surgeon: Rolm Bookbinder, MD;  Location: [place];  Service: General;  Laterality: Right;  . Knee surgery Left 2006    There were no vitals taken for this visit.  Visit Diagnosis:  Abnormality of gait  Lack of coordination  Activity intolerance      Subjective Assessment - 03/20/14 1329    Symptoms Having more headaches than usual; hurts in L temple.   Currently in Pain? Yes   Pain Score 2    Pain Location Head  L temple   Pain Orientation Left   Pain Descriptors / Indicators Aching;Headache  started about 4  days ago   Pain Type Acute pain   Pain Onset In the past 7 days   Pain Frequency Occasional   Aggravating Factors  Worse when looking at something too long (games on computer)   Pain Relieving Factors unsure; hasn't taken any meds for it            [facility] Adult PT Treatment/Exercise - 03/20/14 1331    Transfers   Transfers Sit to Stand   High Level Balance   High Level Balance Activities --  Hip kicks-forward, back, side 10 reps each   High Level Balance Comments stagger stance forward/back weightshifting 10 reps   Exercises   Exercises Balance   Balance Exercises   Sidestepping 3 reps  10 ft at counter   Retro Gait 3 reps  10 ft at counter (forward/back walking)   March on Foam/Wedge 10 reps  solid surface at [employer] balance exercises:  On solid surface with feet apart-head turns x 10, head nods x 10, eyes closed with head steady for 10 seconds no UE support.  PT Education - 03/20/14 1407    Education provided Yes   Education Details Reviewed HEP for PWR! Moves in standing as well as step and weight shift backwards; reiterated benefits to performing HEP daily   Person(s) Educated Patient   Methods Explanation;Demonstration   Comprehension Verbalized understanding;Returned demonstration;Verbal cues required          PT Short Term Goals - 03/20/14 1411    PT SHORT TERM GOAL #1   Title Perform PWR exercises in multiple positions (sitting, standing, prone, supine) with minimal cueing for accuracy (Target date 03/29/14)   Status On-going   PT SHORT TERM GOAL #2   Title Demonstrate turning 180 degress taking >4 steps to complete safely (target date 03/29/14)   Status On-going   PT SHORT TERM GOAL #3   Title Improve TUG score to <17 seconds  (Target date 03/29/14)   Status On-going   PT SHORT TERM GOAL #4   Title Improve BERG balance score to >42/56  (Target date 03/29/14)   Status On-going          PT Long Term Goals - 03/20/14 1412    PT LONG  TERM GOAL #1   Title Verbalize/demonstrate understanding of fall prevention strategies within the home (Target date 04/27/14)   Status On-going   PT LONG TERM GOAL #2   Title Perform HEP consisting of PWR! exercise flow progression independently (Target date 04/27/14)   Status On-going   PT LONG TERM GOAL #3   Title Improve TUG score to <13.5 seconds (Target date 04/27/14)   Status On-going   PT LONG TERM GOAL #4   Title Improve BERG balance score to >48/56 (Target date 04/27/14)   Status On-going   PT LONG TERM GOAL #5   Title Verbalize understanding of local Parkinson's community resources available   Status On-going   PT LONG TERM GOAL #6   Title Increase FOTO score to >10 points   Status On-going          Plan - 03/20/14 1410    Clinical Impression Statement Pt appears to have increased stability with balance exercises today.   Pt will benefit from skilled therapeutic intervention in order to improve on the following deficits Abnormal gait;Difficulty walking;Postural dysfunction;Decreased endurance;Decreased activity tolerance;Decreased balance;Decreased mobility;Decreased knowledge of use of DME   Rehab Potential Good   PT Frequency 2x / week   PT Duration 8 weeks   PT Treatment/Interventions Patient/family education;Therapeutic activities;Therapeutic exercise;Gait training;Balance training;Neuromuscular re-education;Energy conservation;ADLs/Self Care Home Management;Stair training;Functional mobility training;DME Instruction   PT Next Visit Plan Counter balance activities and corner balance activities for dynamic balance; trial gait with cane; check STGs next week/check schedule        Problem List Patient Active Problem List   Diagnosis Date Noted  . Restless leg syndrome 06/28/2013  . MRI of brain abnormal 12/26/2012  . Complex partial seizures 12/26/2012  . Memory loss 12/26/2012                            PWR [facility]) - 03/20/14 1336    PWR!  exercises Moves in standing   PWR! Up 10   PWR! Rock 10   PWR! Twist 10  cues for scapular squeezes and deliberate movement   PWR Step Side, back with UE support  Review of HEP-pt requires verbal/visual cues  Rosamond Andress W. 03/20/2014, 2:14 PM   [place], PT 03/20/2014 2:16 PM Phone: [phone] Fax: [phone]

## 2014-03-21 ENCOUNTER — Ambulatory Visit: Payer: Medicare Other | Admitting: Physical Therapy

## 2014-03-21 NOTE — Telephone Encounter (Signed)
pls fill form. I can sign. -VRP

## 2014-03-22 NOTE — Telephone Encounter (Signed)
Called patient. She will pick up [place] next week when she has PT next door.

## 2014-03-26 ENCOUNTER — Encounter: Payer: Self-pay | Admitting: Physical Therapy

## 2014-03-26 ENCOUNTER — Ambulatory Visit: Payer: Medicare Other | Admitting: Physical Therapy

## 2014-03-26 DIAGNOSIS — R269 Unspecified abnormalities of gait and mobility: Secondary | ICD-10-CM

## 2014-03-26 DIAGNOSIS — R279 Unspecified lack of coordination: Secondary | ICD-10-CM

## 2014-03-26 DIAGNOSIS — R6889 Other general symptoms and signs: Secondary | ICD-10-CM

## 2014-03-26 DIAGNOSIS — Z5189 Encounter for other specified aftercare: Secondary | ICD-10-CM | POA: Diagnosis not present

## 2014-03-26 NOTE — Therapy (Signed)
[facility] [address] [place], [place], [address] Phone: [phone]   Fax:  [phone]  Physical Therapy Treatment  Patient Details  Name: Carrie Short MRN: [id number] Date of Birth: 1928-04-03  Encounter Date: 03/26/2014      PT End of Session - 03/26/14 1649    Visit Number 5   Number of Visits 17   Date for PT Re-Evaluation 04/27/13   PT Start Time 1450   PT Stop Time 1530   PT Time Calculation (min) 40 min   Activity Tolerance Patient tolerated treatment well      Past Medical History  Diagnosis Date  . Hypertension   . Dysrhythmia     "my heart skips some beats"  . Anxiety   . Shortness of breath     /w exertion   . Brain tumor (benign) 2010    has consulted /w Dr. Tommi Rumps at [facility], AUG 2013- had MRI  . GERD (gastroesophageal reflux disease)   . H/O hiatal hernia   . Seizures     prior to diagnosed /w brain tumor, on lamictal since    . Arthritis     L arm, hips & knees - recently had injection in R  knee - Dr. Lorene Dy   . Headache(784.0)     pt. believes the recent headaches are related to "brain tumor", states she has a followup appt. wuith Neurologist in Sept.    . High cholesterol     Past Surgical History  Procedure Laterality Date  . Abdominal hysterectomy  1990s  . Brain biopsy  2010    L temple  . Bunionectomy      left foot  . Breast biopsy Right 12/12/2012    Procedure: Right breast wire guided excision;  Surgeon: Rolm Bookbinder, MD;  Location: [place];  Service: General;  Laterality: Right;  . Knee surgery Left 2006    There were no vitals taken for this visit.  Visit Diagnosis:  Abnormality of gait  Lack of coordination  Activity intolerance      Subjective Assessment - 03/26/14 1451    Symptoms Nothing new with balance; no falls   Currently in Pain? Yes   Pain Score 3    Pain Location Head  across temples   Pain Descriptors / Indicators Aching;Headache   Pain Onset  In the past 7 days   Pain Frequency Occasional   Aggravating Factors  Worse when focusing on something too long (Pt to go to eye doctor on Friday)   Pain Relieving Factors unsure            [facility] Adult PT Treatment/Exercise - 03/26/14 1455    Transfers   Transfers Sit to Stand;Stand to Sit   Sit to Stand 7: Independent   Stand to Sit 7: Independent   Ambulation/Gait   Ambulation/Gait Yes   Ambulation/Gait Assistance 4: Min guard   Ambulation Distance (Feet) 150 Feet   Assistive device None   Gait Pattern Step-through pattern;Decreased stride length   Standardized Balance Assessment   Standardized Balance Assessment Timed Up and Go Test;Berg Balance Test  Berg score 47/56   Berg Balance Test   Sit to Stand Able to stand without using hands and stabilize independently   Standing Unsupported Able to stand safely 2 minutes   Sitting with Back Unsupported but Feet Supported on Floor or Stool Able to sit safely and securely 2 minutes   Stand to Sit Sits safely with minimal use of hands  Transfers Able to transfer safely, minor use of hands   Standing Unsupported with Eyes Closed Able to stand 10 seconds safely   Standing Ubsupported with Feet Together Able to place feet together independently and stand 1 minute safely   From Standing, Reach Forward with Outstretched Arm Can reach forward >12 cm safely (5")   From Standing Position, Pick up Object from [place] to pick up shoe safely and easily   From Standing Position, Turn to Look Behind Over each Shoulder Looks behind from both sides and weight shifts well   Turn 360 Degrees Able to turn 360 degrees safely but slowly   Standing Unsupported, Alternately Place Feet on Step/Stool Able to stand independently and complete 8 steps >20 seconds   Standing Unsupported, One Foot in Front Able to plae foot ahead of the other independently and hold 30 seconds   Standing on One Leg Unable to try or needs assist to prevent fall   Total Score  47   Timed Up and Go Test   TUG Normal TUG   Normal TUG (seconds) 20.68  2)20.45 sec; 3) 20.96 sec   High Level Balance   High Level Balance Activities Turns;Marching forwards;Marching backwards   Balance Exercises: Standing   Retro Gait 3 reps  10 ft at counter (forward/back walking)   Sidestepping 3 reps  x 10 ft at counter   Balance Exercises   Tandem Walking 3 round trips  tandem march x 3 reps   March on Foam/Wedge 10 reps  Solid surface at [employer]          PT Education - 03/26/14 1648    Education provided Yes   Education Details Discussed Parkinson's medication management; pt reports not taking regularly due to meds making her sleepy.  Discussed contacting neurologist if PD medications are limiting functional activities to make sure pt is taking correct medication.   Person(s) Educated Patient   Methods Explanation   Comprehension Verbalized understanding    Discussed progress towards goals and continued plan of care.  Pt verbalized understanding.      PT Short Term Goals - 03/26/14 1650    PT SHORT TERM GOAL #2   Title Demonstrate turning 180 degress taking < or equal to (per evaluating therapist) 4 steps to complete safely (target date 03/29/14)   Status Achieved   PT SHORT TERM GOAL #3   Title Improve TUG score to <17 seconds  (Target date 03/29/14)   Status Not Met  TUG score 20.68 sec   PT SHORT TERM GOAL #4   Title Improve BERG balance score to >42/56  (Target date 03/29/14)   Status Achieved  Berg score 47/56            Plan - 03/26/14 1653    Clinical Impression Statement Pt has met STG #2,4.  STG #3 not met, with TUG 20.68 seconds.  Pt appears to be performing HEP at home, not consistently per report.  Pt continues to ambulate with widened base of supprot with decreased arm swing.  Discussed possible use of cane, and pt is not very interested in using cane at this time.   Pt will benefit from skilled therapeutic intervention in order to improve on  the following deficits Abnormal gait;Difficulty walking;Postural dysfunction;Decreased endurance;Decreased activity tolerance;Decreased balance;Decreased mobility;Decreased knowledge of use of DME   Rehab Potential Good   PT Frequency 2x / week   PT Duration 8 weeks   PT Treatment/Interventions Patient/family education;Therapeutic activities;Therapeutic exercise;Gait training;Balance training;Neuromuscular re-education;Energy conservation;ADLs/Self  Care Home Management;Stair training;Functional mobility training;DME Instruction   PT Next Visit Plan Check STG #1; counter and corner balance activities for corner balance   PT Home Exercise Plan Update HEP for dynamic counter exercises                               Problem List Patient Active Problem List   Diagnosis Date Noted  . Restless leg syndrome 06/28/2013  . MRI of brain abnormal 12/26/2012  . Complex partial seizures 12/26/2012  . Memory loss 12/26/2012    MARRIOTT,AMY W. 03/26/2014, 4:55 PM    Mady Haagensen, PT 03/26/2014 4:57 PM Phone: [phone] Fax: [phone]

## 2014-03-28 ENCOUNTER — Ambulatory Visit: Payer: Medicare Other | Admitting: Physical Therapy

## 2014-03-29 ENCOUNTER — Ambulatory Visit: Payer: Medicare Other | Admitting: Physical Therapy

## 2014-04-03 ENCOUNTER — Ambulatory Visit: Payer: Medicare Other | Admitting: Physical Therapy

## 2014-04-03 ENCOUNTER — Encounter: Payer: Self-pay | Admitting: Physical Therapy

## 2014-04-03 DIAGNOSIS — R269 Unspecified abnormalities of gait and mobility: Secondary | ICD-10-CM

## 2014-04-03 DIAGNOSIS — R6889 Other general symptoms and signs: Secondary | ICD-10-CM

## 2014-04-03 DIAGNOSIS — R279 Unspecified lack of coordination: Secondary | ICD-10-CM

## 2014-04-03 DIAGNOSIS — Z5189 Encounter for other specified aftercare: Secondary | ICD-10-CM | POA: Diagnosis not present

## 2014-04-03 NOTE — Patient Instructions (Signed)
Side-Stepping   At the counter, walk to left side with eyes open. Take even steps, leading with same foot. Make sure each foot lifts off the floor. Repeat in opposite direction. Repeat for _3___ repetitions of the length of your counter, per session. Do _2-3___ sessions per day.   Copyright  VHI. All rights reserved.    Backward    At counter, walk forward the length of the counter, then3 backwards with eyes open. Take even steps, making sure each foot lifts off floor. Repeat for _3___ lengths of your counter, per session. Do __1-2__ sessions per day.   Copyright  VHI. All rights reserved.    Marching in Place: Varied Surfaces   At counter, march in place, slowly lifting knees toward ceiling. Repeat _10___ times per session. Do __1-2__ sessions per day.   Copyright  VHI. All rights reserved.

## 2014-04-03 NOTE — Therapy (Signed)
[facility] [address] [place], [place], [address] Phone: [phone]   Fax:  [phone]  Physical Therapy Treatment  Patient Details  Name: Carrie Short MRN: [id number] Date of Birth: 03/08/1928  Encounter Date: 04/03/2014      PT End of Session - 04/03/14 1442    Visit Number 6  G6   Number of Visits 17   Date for PT Re-Evaluation 04/27/13   PT Start Time 1021   PT Stop Time 1101   PT Time Calculation (min) 40 min   Activity Tolerance Patient tolerated treatment well      Past Medical History  Diagnosis Date  . Hypertension   . Dysrhythmia     "my heart skips some beats"  . Anxiety   . Shortness of breath     /w exertion   . Brain tumor (benign) 2010    has consulted /w Dr. Friedman at [facility], AUG 2013- had MRI  . GERD (gastroesophageal reflux disease)   . H/O hiatal hernia   . Seizures     prior to diagnosed /w brain tumor, on lamictal since    . Arthritis     L arm, hips & knees - recently had injection in R  knee - Dr. Ronald Roberts   . Headache(784.0)     pt. believes the recent headaches are related to "brain tumor", states she has a followup appt. wuith Neurologist in Sept.    . High cholesterol     Past Surgical History  Procedure Laterality Date  . Abdominal hysterectomy  1990s  . Brain biopsy  2010    L temple  . Bunionectomy      left foot  . Breast biopsy Right 12/12/2012    Procedure: Right breast wire guided excision;  Surgeon: Matthew Wakefield, MD;  Location: [facility];  Service: General;  Laterality: Right;  . Knee surgery Left 2006    There were no vitals taken for this visit.  Visit Diagnosis:  Lack of coordination  Abnormality of gait  Activity intolerance      Subjective Assessment - 04/03/14 1025    Symptoms Nothing new today   Currently in Pain? Yes   Pain Score 4    Pain Location Head   Pain Orientation Left   Pain Descriptors / Indicators Headache;Aching   Pain Type  Acute pain   Pain Frequency Occasional   Aggravating Factors  Worse when focusin on something too long (got new prescription from MD)   Pain Relieving Factors unsure            [facility] Adult PT Treatment/Exercise - 04/03/14 1439    Transfers   Transfers Sit to Stand;Stand to Sit   Sit to Stand 7: Independent;Without upper extremity assist   Stand to Sit 7: Independent   High Level Balance   High Level Balance Activities Backward walking;Side stepping  Marching in place at counter          PT Education - 04/03/14 1441    Education provided Yes   Education Details Discussed importance of regularly scheduled performance of HEP; pt reports not performing as much as she should; discussed POC; added counter balance exercises to HEP   Person(s) Educated Patient   Methods Explanation;Demonstration   Comprehension Verbalized understanding;Returned demonstration;Verbal cues required          PT Short Term Goals - 04/03/14 1445    PT SHORT TERM GOAL #1   Title Perform PWR exercises in multiple positions (  sitting, standing, prone, supine) with minimal cueing for accuracy (Target date 03/29/14)   Status Not Met          PT Long Term Goals - 04/03/14 1446    PT LONG TERM GOAL #1   Title Verbalize/demonstrate understanding of fall prevention strategies within the home (Target date 04/27/14)   Status On-going   PT LONG TERM GOAL #2   Title Perform HEP consisting of PWR! exercise flow progression independently (Target date 04/27/14)   Status On-going   PT LONG TERM GOAL #3   Title Improve TUG score to <13.5 seconds (Target date 04/27/14)   Status On-going   PT LONG TERM GOAL #4   Title Improve BERG balance score to >48/56 (Target date 04/27/14)   Status On-going   PT LONG TERM GOAL #5   Title Verbalize understanding of local Parkinson's community resources available   Status On-going   PT LONG TERM GOAL #6   Title Increase FOTO score to >10 points   Status On-going           Plan - 04/03/14 1443    Clinical Impression Statement Pt did not met STG #1.  Pt requires frequent verbal cues and visual cues for technique/appropriate performance of HEP.  Pt does report not consistently performing HEP.  Pt would continue to benefit from furhter skilled PT for additional balance, strength, gait activities   Pt will benefit from skilled therapeutic intervention in order to improve on the following deficits Abnormal gait;Difficulty walking;Postural dysfunction;Decreased endurance;Decreased activity tolerance;Decreased balance;Decreased mobility;Decreased knowledge of use of DME   Rehab Potential Good   PT Frequency 2x / week   PT Duration 8 weeks   PT Treatment/Interventions Patient/family education;Therapeutic activities;Therapeutic exercise;Gait training;Balance training;Neuromuscular re-education;Energy conservation;ADLs/Self Care Home Management;Stair training;Functional mobility training;DME Instruction   PT Next Visit Plan Review counter balance exercises added to HEP; work on corner balance activities, gait training                      PWR [facility]) - 04/03/14 1048    PWR! exercises Moves in sitting;Moves in standing   PWR! Up 10   PWR! Rock 10   PWR! Twist 10  cues for scapular squeezes and deliberate movement   PWR Step Side, back, forward stepping x 10 reps each with UE support, with verbal and visual cues for technique   Comments Review of HEP; pt requires frequent cues   PWR! Up 10   PWR! Rock 10   PWR! Twist 10   PWR! Step 10                 Problem List Patient Active Problem List   Diagnosis Date Noted  . Restless leg syndrome 06/28/2013  . MRI of brain abnormal 12/26/2012  . Complex partial seizures 12/26/2012  . Memory loss 12/26/2012    [place] Shor W. 04/03/2014, 2:48 PM     Elvi Leventhal Gerrit Friends, PT 04/03/2014 2:49 PM Phone: [phone] Fax: [phone]

## 2014-04-05 ENCOUNTER — Ambulatory Visit: Payer: Medicare Other | Admitting: Physical Therapy

## 2014-04-06 ENCOUNTER — Ambulatory Visit: Payer: Medicare Other

## 2014-04-06 DIAGNOSIS — R279 Unspecified lack of coordination: Secondary | ICD-10-CM

## 2014-04-06 DIAGNOSIS — Z5189 Encounter for other specified aftercare: Secondary | ICD-10-CM | POA: Diagnosis not present

## 2014-04-06 DIAGNOSIS — R269 Unspecified abnormalities of gait and mobility: Secondary | ICD-10-CM

## 2014-04-06 DIAGNOSIS — R6889 Other general symptoms and signs: Secondary | ICD-10-CM

## 2014-04-06 NOTE — Therapy (Signed)
[facility] [address] [place], [place], [address] Phone: [phone]   Fax:  [phone]  Physical Therapy Treatment  Patient Details  Name: PICCOLA ARICO MRN: [id number] Date of Birth: August 09, 1927  Encounter Date: 04/06/2014      PT End of Session - 04/06/14 1158    Visit Number 7   Number of Visits 17   Date for PT Re-Evaluation 04/27/13   Authorization Type Medicare-G code required after each visit.   PT Start Time 1105   PT Stop Time 1147   PT Time Calculation (min) 42 min      Past Medical History  Diagnosis Date  . Hypertension   . Dysrhythmia     "my heart skips some beats"  . Anxiety   . Shortness of breath     /w exertion   . Brain tumor (benign) 2010    has consulted /w Dr. Tommi Rumps at [facility], AUG 2013- had MRI  . GERD (gastroesophageal reflux disease)   . H/O hiatal hernia   . Seizures     prior to diagnosed /w brain tumor, on lamictal since    . Arthritis     L arm, hips & knees - recently had injection in R  knee - Dr. Lorene Dy   . Headache(784.0)     pt. believes the recent headaches are related to "brain tumor", states she has a followup appt. wuith Neurologist in Sept.    . High cholesterol     Past Surgical History  Procedure Laterality Date  . Abdominal hysterectomy  1990s  . Brain biopsy  2010    L temple  . Bunionectomy      left foot  . Breast biopsy Right 12/12/2012    Procedure: Right breast wire guided excision;  Surgeon: Rolm Bookbinder, MD;  Location: [facility];  Service: General;  Laterality: Right;  . Knee surgery Left 2006    There were no vitals taken for this visit.  Visit Diagnosis:  Abnormality of gait  Lack of coordination  Activity intolerance      Subjective Assessment - 04/06/14 1105    Symptoms When my head hurts, I notice the knot on my forehead gets bigger. I first noticed the knot 2 weeks ago. Denies hitting head. Feels concerned about the knot.  --Therapist suggested pt contact physician about this.   Currently in Pain? Yes   Pain Score 1   It's not too bad, just feels like it's going to start   Pain Location Head     Neuro Re-ed:  6x sit to stand without UE push off or support with good balance and control noted  Corner Balance on two stacked pillows without UE support with CGA: -turning to look over shoulder -standing feet apart eyes open progressed to include head turns, then body turns, then head and body turns with eyes closed -gaze x1 -marching in place with BUE support  Exercises on compliant balance beam: Tandem walking forward and backward with single UE support and eyes open, then with eyes closed.  Lateral walking with then without UE support with eyes open, then lateral walking with UE support and eyes closed with CGA Lateral walking while stepping over obstacles   Then practiced lateral walking while stepping over 1"x4" obstacles on solid surface with UE support and verbal cues + MIN A initially to achieve adequate step length to clear the obstacle, pt progressed to perform with supervision.  PT Education - 04/06/14 1156    Education provided Yes   Education Details instructed to contact her physician regarding coorelation between head aches and forehead protrusion/knot. (knot and headache not significant during session, but pt reports it becomes severe at times"   Person(s) Educated Patient   Methods Explanation   Comprehension Verbalized understanding  Plans to contact physician after Christmas             PT Long Term Goals - 04/06/14 1200    PT LONG TERM GOAL #1   Title Verbalize/demonstrate understanding of fall prevention strategies within the home (Target date 04/27/14)   Status On-going   PT LONG TERM GOAL #2   Title Perform HEP consisting of PWR! exercise flow progression independently (Target date 04/27/14)   Status On-going   PT LONG TERM GOAL #3    Title Improve TUG score to <13.5 seconds (Target date 04/27/14)   Status On-going   PT LONG TERM GOAL #4   Title Improve BERG balance score to >48/56 (Target date 04/27/14)   Status On-going   PT LONG TERM GOAL #5   Title Verbalize understanding of local Parkinson's community resources available   Status On-going   PT LONG TERM GOAL #6   Title Increase FOTO score to >10 points   Status On-going               Plan - 04/06/14 1159    Clinical Impression Statement Pt demonstrated good balance and control with sit to stands without UE support today. Also demonstrated improvement in performance and confidence with stepping over obstacles from start to end of session today. Pt will continue to benefit from skilled PT services to progress toward long term goals.   PT Next Visit Plan continue with balance on compliant surfaces--decreasing UE support, gait training   Consulted and Agree with Plan of Care Patient        Problem List Patient Active Problem List   Diagnosis Date Noted  . Restless leg syndrome 06/28/2013  . MRI of brain abnormal 12/26/2012  . Complex partial seizures 12/26/2012  . Memory loss 12/26/2012    Delrae Sawyers D 04/06/2014, 12:02 PM  [facility] [address] [place], [place], [address] Phone: [phone]   Fax:  [phone]

## 2014-04-09 ENCOUNTER — Ambulatory Visit: Payer: Medicare Other | Admitting: Physical Therapy

## 2014-04-09 ENCOUNTER — Encounter: Payer: Self-pay | Admitting: Physical Therapy

## 2014-04-09 DIAGNOSIS — R6889 Other general symptoms and signs: Secondary | ICD-10-CM

## 2014-04-09 DIAGNOSIS — R279 Unspecified lack of coordination: Secondary | ICD-10-CM

## 2014-04-09 DIAGNOSIS — Z5189 Encounter for other specified aftercare: Secondary | ICD-10-CM | POA: Diagnosis not present

## 2014-04-09 DIAGNOSIS — R269 Unspecified abnormalities of gait and mobility: Secondary | ICD-10-CM

## 2014-04-09 NOTE — Therapy (Signed)
[facility] [address] [place], [place], [address] Phone: [phone]   Fax:  [phone]  Physical Therapy Treatment  Patient Details  Name: Carrie Short MRN: [id number] Date of Birth: August 08, 1927  Encounter Date: 04/09/2014      PT End of Session - 04/10/14 1111    Visit Number 8   Number of Visits 17   Date for PT Re-Evaluation 04/27/13   PT Start Time 1239   PT Stop Time 1317   PT Time Calculation (min) 38 min   Equipment Utilized During Treatment Gait belt   Activity Tolerance Patient tolerated treatment well   Behavior During Therapy [facility] for tasks assessed/performed      Past Medical History  Diagnosis Date  . Hypertension   . Dysrhythmia     "my heart skips some beats"  . Anxiety   . Shortness of breath     /w exertion   . Brain tumor (benign) 2010    has consulted /w Dr. Tommi Rumps at [facility], AUG 2013- had MRI  . GERD (gastroesophageal reflux disease)   . H/O hiatal hernia   . Seizures     prior to diagnosed /w brain tumor, on lamictal since    . Arthritis     L arm, hips & knees - recently had injection in R  knee - Dr. Lorene Dy   . Headache(784.0)     pt. believes the recent headaches are related to "brain tumor", states she has a followup appt. wuith Neurologist in Sept.    . High cholesterol     Past Surgical History  Procedure Laterality Date  . Abdominal hysterectomy  1990s  . Brain biopsy  2010    L temple  . Bunionectomy      left foot  . Breast biopsy Right 12/12/2012    Procedure: Right breast wire guided excision;  Surgeon: Rolm Bookbinder, MD;  Location: [facility];  Service: General;  Laterality: Right;  . Knee surgery Left 2006    There were no vitals taken for this visit.  Visit Diagnosis:  Activity intolerance  Lack of coordination  Abnormality of gait      Subjective Assessment - 04/09/14 1242    Symptoms Had my physical today-everything checked out well as far  as I know.   Currently in Pain? No/denies        Corner balance activities on two stacked pillows without UE support with CGA:  Head turns x 10 reps, Head nods x 10 reps with eyes open, then head turns, head nods x 10 reps with eyes closed, followed by body turns with eyes open/eyes closed.  On compliant blue cushion surface, heel/toe raises x 15 reps; marching in place x 15, forward kicks x 15, forward step taps x 15 reps.  At counter:  Tandem gait forward and back, marching forward and back, tandem march forward, 2 sets x 10 reps each direction.  Sidestepping at counter with and without UE support, with eyes open, then eyes closed with UE support at counter.    Gait Activities:    Gait 230 ft x 2 reps, then gait x 120 ft no device, with initial cues for visual target with gait, then environmental scanning R and L during gait, with close supervision.  With environmental scanning, pt appears to slow gait pattern, widen BOS and experiences several minor LOB, able to regain balance.         PT Short Term Goals - 04/03/14 1445  PT SHORT TERM GOAL #1   Title Perform PWR exercises in multiple positions (sitting, standing, prone, supine) with minimal cueing for accuracy (Target date 03/29/14)   Status Not Met           PT Long Term Goals - 04/06/14 1200    PT LONG TERM GOAL #1   Title Verbalize/demonstrate understanding of fall prevention strategies within the home (Target date 04/27/14)   Status On-going   PT LONG TERM GOAL #2   Title Perform HEP consisting of PWR! exercise flow progression independently (Target date 04/27/14)   Status On-going   PT LONG TERM GOAL #3   Title Improve TUG score to <13.5 seconds (Target date 04/27/14)   Status On-going   PT LONG TERM GOAL #4   Title Improve BERG balance score to >48/56 (Target date 04/27/14)   Status On-going   PT LONG TERM GOAL #5   Title Verbalize understanding of local Parkinson's community resources available   Status On-going   PT  LONG TERM GOAL #6   Title Increase FOTO score to >10 points   Status On-going               Plan - 04/10/14 1112    Clinical Impression Statement Pt demonstrates improved confidence with balance activities from start to finish of PT session today.  She continues to demonstrate forward head posture, looking down at ground with gait, which is improved briefly with verbal cues.  She has widened BOS and several losses of balance during gait with environmental scanning.   Pt will benefit from skilled therapeutic intervention in order to improve on the following deficits Abnormal gait;Difficulty walking;Postural dysfunction;Decreased endurance;Decreased activity tolerance;Decreased balance;Decreased mobility;Decreased knowledge of use of DME   Rehab Potential Good   PT Frequency 2x / week   PT Duration 8 weeks   PT Treatment/Interventions Patient/family education;Therapeutic activities;Therapeutic exercise;Gait training;Balance training;Neuromuscular re-education;Energy conservation;ADLs/Self Care Home Management;Stair training;Functional mobility training;DME Instruction   PT Next Visit Plan continue with balance on compliant surfaces--decreasing UE support, gait training   Consulted and Agree with Plan of Care Patient        Problem List Patient Active Problem List   Diagnosis Date Noted  . Restless leg syndrome 06/28/2013  . MRI of brain abnormal 12/26/2012  . Complex partial seizures 12/26/2012  . Memory loss 12/26/2012    Roel Douthat W. 04/10/2014, 1:01 PM   Mady Haagensen, PT 04/10/2014 1:04 PM Phone: [phone] Fax: [facility] [address] [place], [place], [address] Phone: [phone]   Fax:  [phone]

## 2014-04-10 ENCOUNTER — Ambulatory Visit: Payer: Medicare Other | Admitting: Physical Therapy

## 2014-04-10 DIAGNOSIS — R6889 Other general symptoms and signs: Secondary | ICD-10-CM

## 2014-04-10 DIAGNOSIS — R269 Unspecified abnormalities of gait and mobility: Secondary | ICD-10-CM

## 2014-04-10 DIAGNOSIS — R279 Unspecified lack of coordination: Secondary | ICD-10-CM

## 2014-04-10 DIAGNOSIS — Z5189 Encounter for other specified aftercare: Secondary | ICD-10-CM | POA: Diagnosis not present

## 2014-04-11 NOTE — Therapy (Signed)
[facility] [address] [place], [place], [address] Phone: [phone]   Fax:  [phone]  Physical Therapy Treatment  Patient Details  Name: Carrie Short MRN: [id number] Date of Birth: 1927-09-10  Encounter Date: 04/10/2014      PT End of Session - 04/11/14 0811    Visit Number 9   Number of Visits 17   Date for PT Re-Evaluation 04/27/13   PT Start Time 1115  Pt arrives late   PT Stop Time 1145   PT Time Calculation (min) 30 min   Activity Tolerance Patient tolerated treatment well      Past Medical History  Diagnosis Date  . Hypertension   . Dysrhythmia     "my heart skips some beats"  . Anxiety   . Shortness of breath     /w exertion   . Brain tumor (benign) 2010    has consulted /w Dr. Tommi Rumps at [facility], AUG 2013- had MRI  . GERD (gastroesophageal reflux disease)   . H/O hiatal hernia   . Seizures     prior to diagnosed /w brain tumor, on lamictal since    . Arthritis     L arm, hips & knees - recently had injection in R  knee - Dr. Lorene Dy   . Headache(784.0)     pt. believes the recent headaches are related to "brain tumor", states she has a followup appt. wuith Neurologist in Sept.    . High cholesterol     Past Surgical History  Procedure Laterality Date  . Abdominal hysterectomy  1990s  . Brain biopsy  2010    L temple  . Bunionectomy      left foot  . Breast biopsy Right 12/12/2012    Procedure: Right breast wire guided excision;  Surgeon: Rolm Bookbinder, MD;  Location: [facility];  Service: General;  Laterality: Right;  . Knee surgery Left 2006    There were no vitals taken for this visit.  Visit Diagnosis:  Activity intolerance  Lack of coordination  Abnormality of gait      Subjective Assessment - 04/11/14 0810    Symptoms Running late today because I was shopping and didn't check the time.   Currently in Pain? No/denies      OBJECTIVE: Neuro ReEducation: Corner  balance activities on blue compliant mat surface:  Head turns x 10 reps, head nods x 10 reps with eyes open and eyes closed with intermittent UE support and supervision, with feet apart then progressing to feet together.  Continuing on compliant blue mat surface, marching in place x 15 reps, forward kicks x 15 reps, then forward step taps x 15 reps with UE support and supervision.  At counter on compliant surfaces, forward/backward walking, forward/backward marching, tandem gait then tandem marching, then sidestepping 3 x 10 ft each direction.  Gait: Gait training around gym, 1000 ft, initially using walking poles for facilitation of arm swing progressing to no walking poles.  Without facilitation, pt has difficulty sequencing coordination of reciprocal arm swing with gait.  Therapist provides facilitation at shoulders to assist with trunk rotation and relaxed arm swing.  Pt tends to ambulate with ipsalateral (same arm/same leg) swing pattern.         PT Short Term Goals - 04/03/14 1445    PT SHORT TERM GOAL #1   Title Perform PWR exercises in multiple positions (sitting, standing, prone, supine) with minimal cueing for accuracy (Target date 03/29/14)   Status  Not Met           PT Long Term Goals - 04/06/14 1200    PT LONG TERM GOAL #1   Title Verbalize/demonstrate understanding of fall prevention strategies within the home (Target date 04/27/14)   Status On-going   PT LONG TERM GOAL #2   Title Perform HEP consisting of PWR! exercise flow progression independently (Target date 04/27/14)   Status On-going   PT LONG TERM GOAL #3   Title Improve TUG score to <13.5 seconds (Target date 04/27/14)   Status On-going   PT LONG TERM GOAL #4   Title Improve BERG balance score to >48/56 (Target date 04/27/14)   Status On-going   PT LONG TERM GOAL #5   Title Verbalize understanding of local Parkinson's community resources available   Status On-going   PT LONG TERM GOAL #6   Title Increase FOTO score  to >10 points   Status On-going               Plan - 04/11/14 0812    Clinical Impression Statement Pt has difficulty with coordination of reciprocal arm pattern with gait today, decreased trunk rotation, narrowed base of support and slowed gait pattern overall when not swinging arms with therapist assistance.  Pt would continue to benefit from further skilled PT to address balance, gait and trasnfers.   Pt will benefit from skilled therapeutic intervention in order to improve on the following deficits Abnormal gait;Difficulty walking;Postural dysfunction;Decreased endurance;Decreased activity tolerance;Decreased balance;Decreased mobility;Decreased knowledge of use of DME   Rehab Potential Good   PT Frequency 2x / week   PT Duration 8 weeks   PT Treatment/Interventions Patient/family education;Therapeutic activities;Therapeutic exercise;Gait training;Balance training;Neuromuscular re-education;Energy conservation;ADLs/Self Care Home Management;Stair training;Functional mobility training;DME Instruction   PT Next Visit Plan balance on compliant surfaces, gait training activities with reciprocal arm swing; GCODE NEXT VISIT   Consulted and Agree with Plan of Care Patient        Problem List Patient Active Problem List   Diagnosis Date Noted  . Restless leg syndrome 06/28/2013  . MRI of brain abnormal 12/26/2012  . Complex partial seizures 12/26/2012  . Memory loss 12/26/2012    Euell Schiff W. 04/11/2014, 8:16 AM  Mady Haagensen, PT 04/11/2014 8:27 AM Phone: [phone] Fax: [facility] [address] [place], [place], [address] Phone: [phone]   Fax:  [phone]

## 2014-04-17 ENCOUNTER — Ambulatory Visit: Payer: Medicare Other

## 2014-04-17 DIAGNOSIS — R269 Unspecified abnormalities of gait and mobility: Secondary | ICD-10-CM

## 2014-04-17 DIAGNOSIS — R6889 Other general symptoms and signs: Secondary | ICD-10-CM

## 2014-04-17 DIAGNOSIS — Z5189 Encounter for other specified aftercare: Secondary | ICD-10-CM | POA: Diagnosis not present

## 2014-04-17 DIAGNOSIS — R279 Unspecified lack of coordination: Secondary | ICD-10-CM

## 2014-04-17 NOTE — Therapy (Signed)
[facility] [address] [place], [place], [address] Phone: [phone]   Fax:  [phone]  Physical Therapy Treatment  Patient Details  Name: Carrie Short MRN: [id number] Date of Birth: [birth year]  Encounter Date: 04/17/2014      PT End of Session - 04/17/14 1258    Visit Number 10   Number of Visits 17   Date for PT Re-Evaluation 04/27/13   Authorization Type Medicare-G code required after each visit.   PT Start Time 1107   PT Stop Time 1146   PT Time Calculation (min) 39 min   Equipment Utilized During Treatment Gait belt   Activity Tolerance Patient tolerated treatment well   Behavior During Therapy WFL for tasks assessed/performed      Past Medical History  Diagnosis Date  . Hypertension   . Dysrhythmia     "my heart skips some beats"  . Anxiety   . Shortness of breath     /w exertion   . Brain tumor (benign) 2010    has consulted /w Dr. Tommi Rumps at [facility], AUG 2013- had MRI  . GERD (gastroesophageal reflux disease)   . H/O hiatal hernia   . Seizures     prior to diagnosed /w brain tumor, on lamictal since    . Arthritis     L arm, hips & knees - recently had injection in R  knee - Dr. Lorene Dy   . Headache(784.0)     pt. believes the recent headaches are related to "brain tumor", states she has a followup appt. wuith Neurologist in Sept.    . High cholesterol     Past Surgical History  Procedure Laterality Date  . Abdominal hysterectomy  1990s  . Brain biopsy  2010    L temple  . Bunionectomy      left foot  . Breast biopsy Right 12/12/2012    Procedure: Right breast wire guided excision;  Surgeon: Rolm Bookbinder, MD;  Location: [facility];  Service: General;  Laterality: Right;  . Knee surgery Left 2006    There were no vitals taken for this visit.  Visit Diagnosis:  Abnormality of gait  Activity intolerance  Lack of coordination      Subjective Assessment - 04/17/14 1110    Symptoms Pt denied falls or changes since last visit.   Patient Stated Goals To balance better. "I'm afraid I'll fall."   Currently in Pain? No/denies                    [facility] Adult PT Treatment/Exercise - 04/17/14 1110    Ambulation/Gait   Ambulation/Gait Yes   Ambulation/Gait Assistance 4: Min guard   Ambulation/Gait Assistance Details Pt ambulated over even terrain. Tactile and VC's to improve reciprocal arm swing, PT provided facilitation at B UEs to improve arm swing, pt was then able to perform recipriocal arm swing for approx. 63' before then returning to swinging UEs with ipsilateral LE. Pt required 3 seated rest breaks due to fatigue and slight SOB.   Ambulation Distance (Feet) --  75'x2, 460', 50'x2   Assistive device None   Gait Pattern Step-through pattern;Decreased stride length  occasional narrow BOS while turning   Balance   Balance Assessed Yes   Dynamic Standing Balance   Dynamic Standing - Balance Support Right upper extremity supported;No upper extremity supported   Dynamic Standing - Level of Assistance 4: Min assist;Other (comment)  min guard   Dynamic Standing - Balance Activities  Other (comment)   Dynamic Standing - Comments In parallel bars: with 0-1 UE support pt performed tandem walking on compliant surface and non-compliant surface 4x7', sidestepping on compliant surface 4x7'. VC's to improve weight shifting. Pt required min A during 4 LOB episodes.   Standardized Balance Assessment   Standardized Balance Assessment Berg Balance Test   Berg Balance Test   Sit to Stand Able to stand without using hands and stabilize independently   Standing Unsupported Able to stand safely 2 minutes   Sitting with Back Unsupported but Feet Supported on Floor or Stool Able to sit safely and securely 2 minutes   Stand to Sit Sits safely with minimal use of hands   Transfers Able to transfer safely, minor use of hands   Standing Unsupported with Eyes Closed Able to  stand 10 seconds safely   Standing Ubsupported with Feet Together Able to place feet together independently and stand 1 minute safely   From Standing, Reach Forward with Outstretched Arm Can reach confidently >25 cm (10")  12"   From Standing Position, Pick up Object from Floor Able to pick up shoe, needs supervision  supervision when coming back to full upright position.   From Standing Position, Turn to Look Behind Over each Shoulder Looks behind from both sides and weight shifts well   Turn 360 Degrees Able to turn 360 degrees safely but slowly   Standing Unsupported, Alternately Place Feet on Step/Stool Able to stand independently and complete 8 steps >20 seconds   Standing Unsupported, One Foot in Front Able to plae foot ahead of the other independently and hold 30 seconds   Standing on One Leg Tries to lift leg/unable to hold 3 seconds but remains standing independently   Total Score 48                  PT Short Term Goals - 04/03/14 1445    PT SHORT TERM GOAL #1   Title Perform PWR exercises in multiple positions (sitting, standing, prone, supine) with minimal cueing for accuracy (Target date 03/29/14)   Status Not Met           PT Long Term Goals - 04/06/14 1200    PT LONG TERM GOAL #1   Title Verbalize/demonstrate understanding of fall prevention strategies within the home (Target date 04/27/14)   Status On-going   PT LONG TERM GOAL #2   Title Perform HEP consisting of PWR! exercise flow progression independently (Target date 04/27/14)   Status On-going   PT LONG TERM GOAL #3   Title Improve TUG score to <13.5 seconds (Target date 04/27/14)   Status On-going   PT LONG TERM GOAL #4   Title Improve BERG balance score to >48/56 (Target date 04/27/14)   Status On-going   PT LONG TERM GOAL #5   Title Verbalize understanding of local Parkinson's community resources available   Status On-going   PT LONG TERM GOAL #6   Title Increase FOTO score to >10 points   Status  On-going               Plan - 04/17/14 1258    Clinical Impression Statement Pt demonstrated progress as she was able to ambulate longer distances, and maintain reciprocal arm swing for approx. 50'. Pt's BERG score increased to 48/56, indicating pt is at a moderate risk for falls. Pt tolerated balance activites on compliant surfaces but did require min A to maintain balance during LOB episodes. Continue with POC.  Pt will benefit from skilled therapeutic intervention in order to improve on the following deficits Abnormal gait;Difficulty walking;Postural dysfunction;Decreased endurance;Decreased activity tolerance;Decreased balance;Decreased mobility;Decreased knowledge of use of DME   Rehab Potential Good   Clinical Impairments Affecting Rehab Potential Patient has postural, balance, and ambulation deficits.    PT Frequency 2x / week   PT Duration 8 weeks   PT Treatment/Interventions Patient/family education;Therapeutic activities;Therapeutic exercise;Gait training;Balance training;Neuromuscular re-education;Energy conservation;ADLs/Self Care Home Management;Stair training;Functional mobility training;DME Instruction   PT Next Visit Plan Practice squatting, tall kneeling activites to mimic gardening activites as pt enjoys gardening but fell last Spring while gardening due to impaired balance. Continue gait training with reciprocal arm swing.   Consulted and Agree with Plan of Care Patient          G-Codes - 2014/05/16 1302    Functional Assessment Tool Used BERG: 48/56   Functional Limitation Mobility: Walking and moving around   Mobility: Walking and Moving Around Current Status [phone]) At least 20 percent but less than 40 percent impaired, limited or restricted   Mobility: Walking and Moving Around Goal Status (U0454) At least 1 percent but less than 20 percent impaired, limited or restricted  changed goal status from CJ to CI due to functional gains.      Problem List Patient  Active Problem List   Diagnosis Date Noted  . Restless leg syndrome 06/28/2013  . MRI of brain abnormal 12/26/2012  . Complex partial seizures 12/26/2012  . Memory loss 12/26/2012    Zoe Creasman L 2014/05/16, 1:05 PM  [facility] [address] [place], [place], [address] Phone: [phone]   Fax:  [phone]     Geoffry Paradise, PT,DPT 2014/05/16 1:05 PM Phone: [phone] Fax: [phone]

## 2014-04-19 ENCOUNTER — Encounter: Payer: Self-pay | Admitting: Physical Therapy

## 2014-04-19 ENCOUNTER — Ambulatory Visit: Payer: Medicare Other | Admitting: Physical Therapy

## 2014-04-19 DIAGNOSIS — R279 Unspecified lack of coordination: Secondary | ICD-10-CM

## 2014-04-19 DIAGNOSIS — Z5189 Encounter for other specified aftercare: Secondary | ICD-10-CM | POA: Diagnosis not present

## 2014-04-19 DIAGNOSIS — R269 Unspecified abnormalities of gait and mobility: Secondary | ICD-10-CM

## 2014-04-19 DIAGNOSIS — R6889 Other general symptoms and signs: Secondary | ICD-10-CM

## 2014-04-19 NOTE — Therapy (Signed)
[facility] [address] [place], [place], [address] Phone: [phone]   Fax:  [phone]  Physical Therapy Treatment  Patient Details  Name: Carrie Short MRN: [id number] Date of Birth: 1928/01/27  Encounter Date: 04/19/2014      PT End of Session - 04/19/14 1657    Visit Number 11   Number of Visits 17   Date for PT Re-Evaluation 04/27/13   Authorization Type Medicare-G code required after each visit.   PT Start Time 1453   PT Stop Time 1532   PT Time Calculation (min) 39 min   Equipment Utilized During Treatment Gait belt   Activity Tolerance Patient tolerated treatment well   Behavior During Therapy WFL for tasks assessed/performed      Past Medical History  Diagnosis Date  . Hypertension   . Dysrhythmia     "my heart skips some beats"  . Anxiety   . Shortness of breath     /w exertion   . Brain tumor (benign) 2010    has consulted /w Dr. Tommi Rumps at [facility], AUG 2013- had MRI  . GERD (gastroesophageal reflux disease)   . H/O hiatal hernia   . Seizures     prior to diagnosed /w brain tumor, on lamictal since    . Arthritis     L arm, hips & knees - recently had injection in R  knee - Dr. Lorene Dy   . Headache(784.0)     pt. believes the recent headaches are related to "brain tumor", states she has a followup appt. wuith Neurologist in Sept.    . High cholesterol     Past Surgical History  Procedure Laterality Date  . Abdominal hysterectomy  1990s  . Brain biopsy  2010    L temple  . Bunionectomy      left foot  . Breast biopsy Right 12/12/2012    Procedure: Right breast wire guided excision;  Surgeon: Rolm Bookbinder, MD;  Location: [facility];  Service: General;  Laterality: Right;  . Knee surgery Left 2006    There were no vitals taken for this visit.  Visit Diagnosis:  Activity intolerance  Lack of coordination  Abnormality of gait      Subjective Assessment - 04/19/14 1457    Symptoms Pt denies changes or falls.   Currently in Pain? No/denies                    [facility] Adult PT Treatment/Exercise - 04/19/14 1652    Transfers   Transfers Sit to Stand;Stand to Sit   Sit to Stand 6: Modified independent (Device/Increase time);With upper extremity assist;Without upper extremity assist;From chair/3-in-1;Other/comment  needs UE assist from simulated couch or gardening stool   Stand to Sit 6: Modified independent (Device/Increase time);With upper extremity assist;Without upper extremity assist   High Level Balance   High Level Balance Activities Other (comment)  reaching to pick up cones off floor   High Level Balance Comments compliant and non compliant surface with min guard assist            [facility]) - 04/19/14 1654    Step and Reach Forward Other reps (comment)  20 on compliant and 20 on noncompliant mat   Step and Reach Backward Other reps (comment)  20 on compliant and 20 on noncompliant mat   Step and Reach Sideways Other reps (comment)  20   Other task forward to backward weight shift;Pt using min assist of UE  at counter to balance              PT Short Term Goals - 04/03/14 1445    PT SHORT TERM GOAL #1   Title Perform PWR exercises in multiple positions (sitting, standing, prone, supine) with minimal cueing for accuracy (Target date 03/29/14)   Status Not Met           PT Long Term Goals - 04/06/14 1200    PT LONG TERM GOAL #1   Title Verbalize/demonstrate understanding of fall prevention strategies within the home (Target date 04/27/14)   Status On-going   PT LONG TERM GOAL #2   Title Perform HEP consisting of PWR! exercise flow progression independently (Target date 04/27/14)   Status On-going   PT LONG TERM GOAL #3   Title Improve TUG score to <13.5 seconds (Target date 04/27/14)   Status On-going   PT LONG TERM GOAL #4   Title Improve BERG balance score to >48/56 (Target date 04/27/14)   Status On-going   PT LONG  TERM GOAL #5   Title Verbalize understanding of local Parkinson's community resources available   Status On-going   PT LONG TERM GOAL #6   Title Increase FOTO score to >10 points   Status On-going               Plan - 04/19/14 1658    Clinical Impression Statement Pt continues to be fearful of falls and poor balance reactions with LOB.  Continue PT per POC.   Pt will benefit from skilled therapeutic intervention in order to improve on the following deficits Abnormal gait;Difficulty walking;Postural dysfunction;Decreased endurance;Decreased activity tolerance;Decreased balance;Decreased mobility;Decreased knowledge of use of DME   Rehab Potential Good   PT Frequency 2x / week   PT Duration 8 weeks   PT Treatment/Interventions Patient/family education;Therapeutic activities;Therapeutic exercise;Gait training;Balance training;Neuromuscular re-education;Energy conservation;ADLs/Self Care Home Management;Stair training;Functional mobility training;DME Instruction   PT Next Visit Plan Begin checking goals   Consulted and Agree with Plan of Care Patient        Problem List Patient Active Problem List   Diagnosis Date Noted  . Restless leg syndrome 06/28/2013  . MRI of brain abnormal 12/26/2012  . Complex partial seizures 12/26/2012  . Memory loss 12/26/2012    Narda Bonds 04/19/2014, 4:59 PM  [facility] [address] [place], [place], [address] Phone: [phone]   Fax:  [place], [place] 04/19/2014 4:59 PM Phone: [phone] Fax: [phone]

## 2014-04-23 ENCOUNTER — Encounter: Payer: Self-pay | Admitting: Physical Therapy

## 2014-04-23 ENCOUNTER — Ambulatory Visit: Payer: Medicare Other | Attending: Diagnostic Neuroimaging | Admitting: Physical Therapy

## 2014-04-23 DIAGNOSIS — R6889 Other general symptoms and signs: Secondary | ICD-10-CM | POA: Diagnosis not present

## 2014-04-23 DIAGNOSIS — R279 Unspecified lack of coordination: Secondary | ICD-10-CM | POA: Diagnosis not present

## 2014-04-23 DIAGNOSIS — R269 Unspecified abnormalities of gait and mobility: Secondary | ICD-10-CM | POA: Diagnosis not present

## 2014-04-23 DIAGNOSIS — Z5189 Encounter for other specified aftercare: Secondary | ICD-10-CM | POA: Diagnosis not present

## 2014-04-23 DIAGNOSIS — G20 Parkinson's disease: Secondary | ICD-10-CM | POA: Diagnosis not present

## 2014-04-23 NOTE — Therapy (Signed)
[facility] [address] [place], [place], [address] Phone: [phone]   Fax:  [phone]  Physical Therapy Treatment  Patient Details  Name: Carrie Short MRN: [id number] Date of Birth: 12/28/1927  Encounter Date: 04/23/2014      PT End of Session - 04/23/14 2150    Visit Number 12   Number of Visits 17   Date for PT Re-Evaluation 04/27/13   PT Start Time 1500   PT Stop Time 1530   PT Time Calculation (min) 30 min      Past Medical History  Diagnosis Date  . Hypertension   . Dysrhythmia     "my heart skips some beats"  . Anxiety   . Shortness of breath     /w exertion   . Brain tumor (benign) 2010    has consulted /w Dr. Tommi Rumps at [facility], AUG 2013- had MRI  . GERD (gastroesophageal reflux disease)   . H/O hiatal hernia   . Seizures     prior to diagnosed /w brain tumor, on lamictal since    . Arthritis     L arm, hips & knees - recently had injection in R  knee - Dr. Lorene Dy   . Headache(784.0)     pt. believes the recent headaches are related to "brain tumor", states she has a followup appt. wuith Neurologist in Sept.    . High cholesterol     Past Surgical History  Procedure Laterality Date  . Abdominal hysterectomy  1990s  . Brain biopsy  2010    L temple  . Bunionectomy      left foot  . Breast biopsy Right 12/12/2012    Procedure: Right breast wire guided excision;  Surgeon: Rolm Bookbinder, MD;  Location: [facility];  Service: General;  Laterality: Right;  . Knee surgery Left 2006    There were no vitals taken for this visit.  Visit Diagnosis:  Abnormality of gait      Subjective Assessment - 04/23/14 2144    Symptoms Pt. running 15" late "I looked at my clock wrong"; states she is supposed to finish up this week   Currently in Pain? No/denies                    [facility] Adult PT Treatment/Exercise - 04/23/14 1510    Transfers   Sit to Stand 4: Min guard  from mat -  standing on foam   Ambulation/Gait   Ambulation/Gait Yes   Ambulation/Gait Assistance 4: Min guard  cues for reciprocal arm swing   Ambulation/Gait Assistance Details used walking sticks with PT guiding for improved reciprocal arm swing - pt. has difficulty performing reciprocal arm swing without cues    Ambulation Distance (Feet) 250 Feet   Assistive device None   Gait Pattern Decreased stride length     Neuro- Re-ed; pt. Performed rockerboard inside bars for anterior/posterior weight shift with UE support with CGA;   BIG exercises including stepping forward with UE movement, laterally with UE movement for incr. Trunk rotation and  Backward with trunk rotation with UE extension 10 reps each exercise  Alternate tap ups to 6" step x 10 reps each LE with SBA with UE support prn             PT Short Term Goals - 04/03/14 1445    PT SHORT TERM GOAL #1   Title Perform PWR exercises in multiple positions (sitting, standing, prone, supine) with minimal  cueing for accuracy (Target date 03/29/14)   Status Not Met           PT Long Term Goals - 04/06/14 1200    PT LONG TERM GOAL #1   Title Verbalize/demonstrate understanding of fall prevention strategies within the home (Target date 04/27/14)   Status On-going   PT LONG TERM GOAL #2   Title Perform HEP consisting of PWR! exercise flow progression independently (Target date 04/27/14)   Status On-going   PT LONG TERM GOAL #3   Title Improve TUG score to <13.5 seconds (Target date 04/27/14)   Status On-going   PT LONG TERM GOAL #4   Title Improve BERG balance score to >48/56 (Target date 04/27/14)   Status On-going   PT LONG TERM GOAL #5   Title Verbalize understanding of local Parkinson's community resources available   Status On-going   PT LONG TERM GOAL #6   Title Increase FOTO score to >10 points   Status On-going               Plan - 04/23/14 2150    Clinical Impression Statement pt. has difficulty coordinating  reciprocal arm swing in gait   Pt will benefit from skilled therapeutic intervention in order to improve on the following deficits Abnormal gait;Difficulty walking;Postural dysfunction;Decreased endurance;Decreased activity tolerance;Decreased balance;Decreased mobility;Decreased knowledge of use of DME   Rehab Potential Good   PT Frequency 2x / week   PT Duration 8 weeks   PT Treatment/Interventions Patient/family education;Therapeutic activities;Therapeutic exercise;Gait training;Balance training;Neuromuscular re-education;Energy conservation;ADLs/Self Care Home Management;Stair training;Functional mobility training;DME Instruction   PT Next Visit Plan check goals  (d/c?)   PT Home Exercise Plan added alternate stepping with UE flexion to simulate arm swing in gait   Consulted and Agree with Plan of Care Patient        Problem List Patient Active Problem List   Diagnosis Date Noted  . Restless leg syndrome 06/28/2013  . MRI of brain abnormal 12/26/2012  . Complex partial seizures 12/26/2012  . Memory loss 12/26/2012    Alda Lea, PT 04/23/2014, 9:55 PM  [facility] [address] [place], [place], [address] Phone: [phone]   Fax:  [phone]

## 2014-04-25 ENCOUNTER — Encounter: Payer: Self-pay | Admitting: Physical Therapy

## 2014-04-25 ENCOUNTER — Ambulatory Visit: Payer: Medicare Other | Admitting: Physical Therapy

## 2014-04-25 DIAGNOSIS — R6889 Other general symptoms and signs: Secondary | ICD-10-CM

## 2014-04-25 DIAGNOSIS — G20 Parkinson's disease: Secondary | ICD-10-CM | POA: Diagnosis not present

## 2014-04-25 DIAGNOSIS — R269 Unspecified abnormalities of gait and mobility: Secondary | ICD-10-CM

## 2014-04-25 DIAGNOSIS — R279 Unspecified lack of coordination: Secondary | ICD-10-CM

## 2014-04-25 DIAGNOSIS — Z5189 Encounter for other specified aftercare: Secondary | ICD-10-CM | POA: Diagnosis not present

## 2014-04-25 NOTE — Patient Instructions (Signed)

## 2014-04-26 DIAGNOSIS — R6889 Other general symptoms and signs: Secondary | ICD-10-CM | POA: Diagnosis not present

## 2014-04-26 DIAGNOSIS — Z5189 Encounter for other specified aftercare: Secondary | ICD-10-CM | POA: Diagnosis not present

## 2014-04-26 DIAGNOSIS — R269 Unspecified abnormalities of gait and mobility: Secondary | ICD-10-CM | POA: Diagnosis not present

## 2014-04-26 DIAGNOSIS — G20 Parkinson's disease: Secondary | ICD-10-CM | POA: Diagnosis not present

## 2014-04-26 DIAGNOSIS — R279 Unspecified lack of coordination: Secondary | ICD-10-CM | POA: Diagnosis not present

## 2014-04-26 NOTE — Therapy (Signed)
[facility] [address] [place], [place], [address] Phone: [phone]   Fax:  [phone]  Physical Therapy Treatment  Patient Details  Name: Carrie Short MRN: [id number] Date of Birth: 1927-08-26 Referring Provider:  Lorene Dy, MD  Encounter Date: 04/25/2014      PT End of Session - 04/26/14 1715    Visit Number 13   PT Start Time 1104   PT Stop Time 1146   PT Time Calculation (min) 42 min   Activity Tolerance Patient tolerated treatment well      Past Medical History  Diagnosis Date  . Hypertension   . Dysrhythmia     "my heart skips some beats"  . Anxiety   . Shortness of breath     /w exertion   . Brain tumor (benign) 2010    has consulted /w Dr. Tommi Rumps at [facility], AUG 2013- had MRI  . GERD (gastroesophageal reflux disease)   . H/O hiatal hernia   . Seizures     prior to diagnosed /w brain tumor, on lamictal since    . Arthritis     L arm, hips & knees - recently had injection in R  knee - Dr. Lorene Dy   . Headache(784.0)     pt. believes the recent headaches are related to "brain tumor", states she has a followup appt. wuith Neurologist in Sept.    . High cholesterol     Past Surgical History  Procedure Laterality Date  . Abdominal hysterectomy  1990s  . Brain biopsy  2010    L temple  . Bunionectomy      left foot  . Breast biopsy Right 12/12/2012    Procedure: Right breast wire guided excision;  Surgeon: Rolm Bookbinder, MD;  Location: [facility];  Service: General;  Laterality: Right;  . Knee surgery Left 2006    There were no vitals taken for this visit.  Visit Diagnosis:  Abnormality of gait  Activity intolerance  Lack of coordination      Subjective Assessment - 04/25/14 1109    Symptoms Pt reports she feels some better.  "This is my last visit."   Currently in Pain? No/denies          [facility] PT Assessment - 04/25/14 1115    Berg Balance Test   Sit to Stand Able  to stand without using hands and stabilize independently   Standing Unsupported Able to stand safely 2 minutes   Sitting with Back Unsupported but Feet Supported on Floor or Stool Able to sit safely and securely 2 minutes   Stand to Sit Sits safely with minimal use of hands   Transfers Able to transfer safely, minor use of hands   Standing Unsupported with Eyes Closed Able to stand 10 seconds safely   Standing Ubsupported with Feet Together Able to place feet together independently and stand for 1 minute with supervision   From Standing, Reach Forward with Outstretched Arm Can reach confidently >25 cm (10")   From Standing Position, Pick up Object from Floor Able to pick up shoe safely and easily   From Standing Position, Turn to Look Behind Over each Shoulder Looks behind from both sides and weight shifts well   Turn 360 Degrees Able to turn 360 degrees safely but slowly   Standing Unsupported, Alternately Place Feet on Step/Stool Able to stand independently and safely and complete 8 steps in 20 seconds   Standing Unsupported, One Foot in [place] to  plae foot ahead of the other independently and hold 30 seconds   Standing on One Leg Tries to lift leg/unable to hold 3 seconds but remains standing independently   Total Score 49   Timed Up and Go Test   Normal TUG (seconds) 18.63                          PT Education - 05-03-2014 1714    Education Details Fall prevention, progress towards goals, provided info on AHOY exercises on [address]; discussed discharge today.   Person(s) Educated Patient   Methods Explanation;Handout   Comprehension Verbalized understanding          PT Short Term Goals - 04/03/14 1445    PT SHORT TERM GOAL #1   Title Perform PWR exercises in multiple positions (sitting, standing, prone, supine) with minimal cueing for accuracy (Target date 03/29/14)   Status Not Met           PT Long Term Goals - 03-May-2014 1716    PT LONG TERM GOAL #1    Title Verbalize/demonstrate understanding of fall prevention strategies within the home (Target date 04/27/14)   Status Achieved   PT LONG TERM GOAL #2   Title Perform HEP consisting of PWR! exercise flow progression independently (Target date 04/27/14)   Status Not Met  Pt not consistently performing HEP; need cues to perform   PT LONG TERM GOAL #3   Title Improve TUG score to <13.5 seconds (Target date 04/27/14)   Status Not Met   PT LONG TERM GOAL #5   Title Verbalize understanding of local Parkinson's community resources available   Status Achieved   PT LONG TERM GOAL #6   Title Increase FOTO score to >10 points   Status Achieved  FOTO score improved 36>56               Plan - 2014-05-03 1718    Clinical Impression Statement Pt has met LTG#1, 4, 5, and 6.  LTG# 2 and 3 not met.  Pt has not been consistently performing HEP, and continues to have some coordination difficulties with arm swing and gait.  She is not interested in using cane.     PT Next Visit Plan Discharge this visit          G-Codes - 03-May-2014 1720    Functional Assessment Tool Used Merrilee Jansky 49/56, TUG 18.63   Functional Limitation Mobility: Walking and moving around   Mobility: Walking and Moving Around Goal Status [phone]) At least 20 percent but less than 40 percent impaired, limited or restricted   Mobility: Walking and Moving Around Discharge Status [id number]) At least 20 percent but less than 40 percent impaired, limited or restricted      Problem List Patient Active Problem List   Diagnosis Date Noted  . Restless leg syndrome 06/28/2013  . MRI of brain abnormal 12/26/2012  . Complex partial seizures 12/26/2012  . Memory loss 12/26/2012   PHYSICAL THERAPY DISCHARGE SUMMARY  Visits from Start of Care: 13  Current functional level related to goals / functional outcomes: See goal notes above   Remaining deficits: Balance   Education / Equipment: Pt has been educated in HEP, fall prevention, local  Parkinson's resources.  Plan: Patient agrees to discharge.  Patient goals were not met. Patient is being discharged due to                                                     ?????  maximizing rehab potential at this time.     Frazier Butt 04/26/2014, 5:23 PM  [facility] [address] [place], [place], [address] Phone: [phone]   Fax:  [phone]

## 2014-05-28 ENCOUNTER — Telehealth: Payer: Self-pay | Admitting: *Deleted

## 2014-05-28 NOTE — Telephone Encounter (Signed)
Spoke with the pt on the phone and got her appt rescheduled tp 1 pm 05/29/2014

## 2014-05-29 ENCOUNTER — Encounter: Payer: Self-pay | Admitting: Diagnostic Neuroimaging

## 2014-05-29 ENCOUNTER — Ambulatory Visit (INDEPENDENT_AMBULATORY_CARE_PROVIDER_SITE_OTHER): Payer: Medicare Other | Admitting: Diagnostic Neuroimaging

## 2014-05-29 VITALS — Ht 62.0 in | Wt 159.6 lb

## 2014-05-29 DIAGNOSIS — R51 Headache: Secondary | ICD-10-CM | POA: Diagnosis not present

## 2014-05-29 DIAGNOSIS — G2581 Restless legs syndrome: Secondary | ICD-10-CM

## 2014-05-29 DIAGNOSIS — R519 Headache, unspecified: Secondary | ICD-10-CM

## 2014-05-29 DIAGNOSIS — G20 Parkinson's disease: Secondary | ICD-10-CM

## 2014-05-29 DIAGNOSIS — R930 Abnormal findings on diagnostic imaging of skull and head, not elsewhere classified: Secondary | ICD-10-CM

## 2014-05-29 DIAGNOSIS — R413 Other amnesia: Secondary | ICD-10-CM | POA: Diagnosis not present

## 2014-05-29 DIAGNOSIS — R9089 Other abnormal findings on diagnostic imaging of central nervous system: Secondary | ICD-10-CM

## 2014-05-29 NOTE — Patient Instructions (Signed)
Increase ropinirole to 1mg  twice a day.  Please stop driving.

## 2014-05-29 NOTE — Progress Notes (Signed)
[facility]  PATIENT: Carrie Short DOB: [birth year]  REFERRING CLINICIAN:  HISTORY FROM: patient and daughter REASON FOR VISIT: follow up   HISTORICAL  CHIEF COMPLAINT:  Chief Complaint  Patient presents with  . Follow-up    lump on forehead which causes a headache     HISTORY OF PRESENT ILLNESS:   UPDATE 05/29/14: since last visit, has been using daily ropinirole 1mg  5x per month, and continues every nightly 1mg  dose. Some benefit with tremor. Also tried PT, with some benefit. Not using walker. Daughter concerned about memory loss and pt ability to drive. Daughter reports pt gets lost when driving, suddenly pulls out in front of other cars, and is not safe. Patient also concerned about left supra-orbital swelling and headache.  UPDATE 12/27/13: Since last visit, more tremor, more gait diff. No seizures. Memory loss slightly worse. Still lives at home. Son helps her with yard work. She fell a few months ago, and had right wrist fracture.   UPDATE 06/28/13: Since last visit, more knee/hip/back pain. Restless legs slightly worse. Now with tremor (with handwriting). More balance difficulty. Also with some shortness of breath and emphysematous changes on lung testing.  UPDATE 12/26/12: Since last visit, stable. Some intermittent HA, left sided. Some intermittent left facial numbness. Some memory loss. Got lost driving to this appt this AM, and had to call her daughter for directions.  Still lives alone (with dog). Daughter and son call daily to check on her.  PRIOR HPI (06/13/12, Dr. Erling Cruz):  81 year old right-handed white widowed female with a 3-1/2 year history of hesitancy in speech, using neologisms and making inappropriate comments, at times associated with a blank stare. I saw her 12/08/2008 with normal neurologic examination, MMSE 25/30, CDT 4/4, AFT 10.  I started her on Aricept,venlafaxine was increased, and aspirin was continued. Evaluation included sedimentation rate,  RPR, TSH, B12, and  CPK which were normal. MRI study of the brain 06/07/2008 was felt to be normal. CT scans of the brain 05/06/2007 and  10/12/2008 showed minimal small vessel disease and atrophy. EEG 12/13/2008 showed left-sided intermittent  slowing in the temporal  region. Repeat MRI study 03/04/2009 showed an asymmetry in the left temporal lobe versus the right, being larger, and having T1 signal alterations on the medial aspect and brighter signal in the left mesial temporal lobe on flair. 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 In review of the MRI 06/07/2008. Dr. Derrill Memo at [facility] performed a stereotactic left temporal  biopsy 06/13/2009 with no pathologic diagnosis of  tumor present.  There was astrocytic hypertrophy and there were "hypertensive vascular changes present".  She did not feel "generally well" without documented seizures. She complained of lack of energy.She was taken off of levetiracetam and  started on lamotrigine, progressing to 100 mg, one half twice daily. She states that she is confused at times with difficulty thinking of words. She denies auras of seizures. She occasionally stumbles. She does not notice language problems. Functionally she can cook, shop, clean, do laundry, make the bed and dishes, and mow the yard. She drives. She lives alone She has lost interest in traveling.  She has spells that come over her with ringing of her hands and moving her feet and cannot  be still. These occur while sitting or at nighttime without loss of consciousness  and last 15 minutes. She treats for restless leg symptoms with Aleve.They usually occur  when she is going to bed or sitting on the sofa.She occasionally has left-sided headache.  She was  seen by Dr. Yetta Glassman 11/30/2011. Her MRI report 11/29/2011 was not changed in the left hypertrophied temporal lobe according to the patient.she is to see him in two years. She says her  memory is okay. She enjoys socializing with a group of friends at [employer] having her breakfast. 06/13/2012=( MMSE 25/30. Clock drawing task 4/4.Animal fluency test 9. Ron Parker  Index of independence in activities of daily 6. [place] instrumental activities of daily living scale 8. Neuropsychiatric inventory=  Apathy 4. Geriatric depression scale 1/15.Falls assessment tool score 15).   REVIEW OF SYSTEMS: Full 14 system review of systems performed and notable only for eye itching RLS gait diff daytime sleepiness.   ALLERGIES: No Known Allergies  HOME MEDICATIONS:  Outpatient Prescriptions Prior to Visit  Medication Sig Dispense Refill  . acetaminophen (TYLENOL) 325 MG tablet Take 650 mg by mouth every 6 (six) hours as needed for pain.    . Calcium Carbonate-Vitamin D (CALCIUM 600 + D PO) Take 1 tablet by mouth daily.     . DiphenhydrAMINE HCl, Sleep, (ZZZQUIL PO) Take by mouth.    . lamoTRIgine (LAMICTAL) 100 MG tablet One full tab in the a.m., one half tab at night (150mg  total daily dose) 135 tablet 2  . metoprolol tartrate (LOPRESSOR) 25 MG tablet Take 25 mg by mouth daily.    Marland Kitchen rOPINIRole (REQUIP) 1 MG tablet Take 1 tablet (1 mg total) by mouth 2 (two) times daily. 180 tablet 4  . calcium carbonate (TUMS EX) 750 MG chewable tablet Chew 1 tablet by mouth daily as needed for heartburn.    Marland Kitchen LORazepam (ATIVAN) 1 MG tablet Take 1 mg by mouth daily as needed (for sleep).    . metoprolol succinate (TOPROL-XL) 25 MG 24 hr tablet Take 1 tablet by mouth daily.    Marland Kitchen OVER THE COUNTER MEDICATION Take 5 mL by mouth at bedtime as needed ("Z-Z-Z-Quil" for sleep).     . venlafaxine XR (EFFEXOR-XR) 75 MG 24 hr capsule Take 75 mg by mouth daily before breakfast.     No facility-administered medications prior to visit.    PAST MEDICAL HISTORY: Past Medical History  Diagnosis Date  . Hypertension   . Dysrhythmia     "my heart skips some beats"  . Anxiety   . Shortness of breath     /w exertion     . Brain tumor (benign) 2010    has consulted /w Dr. Tommi Rumps at [facility], AUG 2013- had MRI  . GERD (gastroesophageal reflux disease)   . H/O hiatal hernia   . Seizures     prior to diagnosed /w brain tumor, on lamictal since    . Arthritis     L arm, hips & knees - recently had injection in R  knee - Dr. Lorene Dy   . Headache(784.0)     pt. believes the recent headaches are related to "brain tumor", states she has a followup appt. wuith Neurologist in Sept.    . High cholesterol     PAST SURGICAL HISTORY: Past Surgical History  Procedure Laterality Date  . Abdominal hysterectomy  1990s  . Brain biopsy  2010    L temple  . Bunionectomy      left foot  . Breast biopsy Right 12/12/2012    Procedure: Right breast wire guided excision;  Surgeon: Rolm Bookbinder, MD;  Location: [facility];  Service: General;  Laterality: Right;  . Knee surgery Left 2006    FAMILY HISTORY: Family History  Problem Relation Age of Onset  . Heart disease Mother   . Heart attack Mother   . Cancer Daughter     breast mets to lung  . Diabetes Son   . Colon cancer Father     SOCIAL HISTORY:  History   Social History  . Marital Status: Widowed    Spouse Name: N/A    Number of Children: 2  . Years of Education: College   Occupational History  . Retired    Social History Main Topics  . Smoking status: Never Smoker   . Smokeless tobacco: Never Used  . Alcohol Use: No  . Drug Use: No  . Sexual Activity: Not on file   Other Topics Concern  . Not on file   Social History Narrative   Patient lives at home alone.   Caffeine Use: 1-2 cup of caffeine daily    PHYSICAL EXAM  Filed Vitals:   05/29/14 1310  Height: 5\' 2"  (1.575 m)  Weight: 159 lb 9.6 oz (72.394 kg)    Not recorded     MMSE - Mini Mental State Exam 05/29/2014  Orientation to time 5  Orientation to Place 5  Registration 3  Attention/ Calculation 2  Recall 3  Language- name 2 objects 2  Language- repeat 0  Language-  follow 3 step command 2  Language- read & follow direction 1  Write a sentence 1  Copy design 0  Total score 24     Body mass index is 29.18 kg/(m^2).  GENERAL EXAM: Patient is in no distress; BONY RIDGING OVER BILATERAL SUPRA-ORBITAL REGIONS; NON-TENDER  CARDIOVASCULAR: Regular rate and rhythm, no murmurs, no carotid bruits  NEUROLOGIC: MENTAL STATUS: awake, alert, language fluent, comprehension intact, naming intact; POSITIVE MYERSONS, BORDERLINE SNOUT, POSITIVE PALMOMENTAL.  CRANIAL NERVE: pupils equal and reactive to light, visual fields full to confrontation, extraocular muscles intact, no nystagmus, facial sensation and strength symmetric, uvula midline, shoulder shrug symmetric, tongue midline. MILD VOICE TREMOR. MOTOR: normal bulk; MILD COGWHEELING IN LUE WITH CONTRALATERAL REINFORCEMENT, MILD APRAXIA IN BUE, MODERATE BRADYKINESIA IN LUE > RUE; LLE > RLE. MILD POSTURAL TREMOR. Full strength in the BUE, BLE. SENSORY: normal and symmetric to light touch COORDINATION: finger-nose-finger, fine finger movements SLOW. REFLEXES: deep tendon reflexes TRACE AND SYMM. GAIT/STATION: narrow based gait; SLOW TO STAND. UNSTEADY GAIT. ANTALGIC. DECR ARM SWING. STOOPED POSTURE.    DIAGNOSTIC DATA (LABS, IMAGING, TESTING) - I reviewed patient records, labs, notes, testing and imaging myself where available.  Lab Results  Component Value Date   WBC 6.8 12/05/2012   HGB 15.5* 12/05/2012   HCT 45.4 12/05/2012   MCV 96.8 12/05/2012   PLT 202 12/05/2012      Component Value Date/Time   NA 138 12/05/2012 1301   K 4.2 12/05/2012 1301   CL 103 12/05/2012 1301   CO2 28 12/05/2012 1301   GLUCOSE 75 12/05/2012 1301   BUN 11 12/05/2012 1301   CREATININE 0.66 12/05/2012 1301   CALCIUM 9.8 12/05/2012 1301   GFRNONAA 79* 12/05/2012 1301   GFRAA >90 12/05/2012 1301   No results found for: CHOL No results found for: HGBA1C No results found for: VITAMINB12 No results found for:  TSH  06/29/12 MRI brain (with and without contrast) demonstrating: 1. Slight asymmetry of the mesial temporal lobes, with the left side slightly larger than the right. 2. Mild perisylvian and right mesial  temporal atrophy.  3. Mild periventricular and subcortical non-specific foci of gliosis, likely chronic small vessel ischemic disease. 4. Mucosal thickening and bubbly secretions in the ethmoid, frontal and maxillary sinuses. 5. Compared to prior MRI from 03/04/09 there is no significant interval change.   ASSESSMENT AND PLAN  81 y.o. year old female here with history of complex partial seizures, abnormal MRI brain (s/p left temporal lobe biopsy --> astrocytic hypertrophy and hypertensive vascular changes). Not clearly neoplastic. Also with RLS on ropinirole.   Also with parkinsonian features and memory loss. Could be parkinson's disease dementia vs dementia with lewy bodies.   PLAN: - Continue lamotrigine for seizure prevention - Increase ropinirole to 1mg  BID for RLS + parkinsonian features - caution with driving and living alone; advised to consider having son live with her and getting home aid as well as transitioning away from driving - may consider donepezil or Exelon at next visit  Return in about 3 months (around 08/27/2014).    Penni Bombard, MD 11/20/9935, 1:69 PM Certified in Neurology, Neurophysiology and Neuroimaging  [facility] [address] [place], [place] [address] [phone]

## 2014-06-06 ENCOUNTER — Other Ambulatory Visit: Payer: Self-pay

## 2014-06-06 DIAGNOSIS — Z1231 Encounter for screening mammogram for malignant neoplasm of breast: Secondary | ICD-10-CM

## 2014-06-18 ENCOUNTER — Ambulatory Visit: Payer: Medicare Other

## 2014-06-20 ENCOUNTER — Ambulatory Visit
Admission: RE | Admit: 2014-06-20 | Discharge: 2014-06-20 | Disposition: A | Discharge status: Home / Self Care | Payer: Medicare Other | Source: Ambulatory Visit

## 2014-06-20 DIAGNOSIS — Z1231 Encounter for screening mammogram for malignant neoplasm of breast: Secondary | ICD-10-CM | POA: Diagnosis not present

## 2014-06-27 ENCOUNTER — Ambulatory Visit: Payer: Medicare Other | Admitting: Diagnostic Neuroimaging

## 2014-07-30 DIAGNOSIS — I10 Essential (primary) hypertension: Secondary | ICD-10-CM | POA: Diagnosis not present

## 2014-08-28 ENCOUNTER — Encounter: Payer: Self-pay | Admitting: Diagnostic Neuroimaging

## 2014-08-28 ENCOUNTER — Ambulatory Visit (INDEPENDENT_AMBULATORY_CARE_PROVIDER_SITE_OTHER): Payer: Medicare Other | Admitting: Diagnostic Neuroimaging

## 2014-08-28 VITALS — BP 157/84 | HR 69 | Ht 62.0 in | Wt 158.3 lb

## 2014-08-28 DIAGNOSIS — G20 Parkinson's disease: Secondary | ICD-10-CM | POA: Diagnosis not present

## 2014-08-28 DIAGNOSIS — R9089 Other abnormal findings on diagnostic imaging of central nervous system: Secondary | ICD-10-CM

## 2014-08-28 DIAGNOSIS — R930 Abnormal findings on diagnostic imaging of skull and head, not elsewhere classified: Secondary | ICD-10-CM

## 2014-08-28 DIAGNOSIS — G40209 Localization-related (focal) (partial) symptomatic epilepsy and epileptic syndromes with complex partial seizures, not intractable, without status epilepticus: Secondary | ICD-10-CM

## 2014-08-28 DIAGNOSIS — R413 Other amnesia: Secondary | ICD-10-CM | POA: Diagnosis not present

## 2014-08-28 DIAGNOSIS — G2581 Restless legs syndrome: Secondary | ICD-10-CM | POA: Diagnosis not present

## 2014-08-28 MED ORDER — LAMOTRIGINE 100 MG PO TABS: ORAL_TABLET | ORAL | Status: DC

## 2014-08-28 MED ORDER — ROPINIROLE HCL 1 MG PO TABS: 1.0000 mg | ORAL_TABLET | Freq: Two times a day (BID) | ORAL | Status: DC

## 2014-08-28 NOTE — Progress Notes (Signed)
[facility]  PATIENT: Carrie Short DOB: 15-Apr-1928  REFERRING CLINICIAN:  HISTORY FROM: patient REASON FOR VISIT: follow up   HISTORICAL  CHIEF COMPLAINT:  Chief Complaint  Patient presents with  . Follow-up    Parkinsonism    HISTORY OF PRESENT ILLNESS:   UPDATE 08/28/14: Since last visit, has transitioned away from driving. Now son stays with her at night. No sz. Doing well on ropinirole. Having more nausea, and PCP advised to try stopping ropinirole. No benefit with nausea. Now pt back on ropinirole.  UPDATE 05/29/14: since last visit, has been using daily ropinirole 1mg  5x per month, and continues every nightly 1mg  dose. Some benefit with tremor. Also tried PT, with some benefit. Not using walker. Daughter concerned about memory loss and pt ability to drive. Daughter reports pt gets lost when driving, suddenly pulls out in front of other cars, and is not safe. Patient also concerned about left supra-orbital swelling and headache.  UPDATE 12/27/13: Since last visit, more tremor, more gait diff. No seizures. Memory loss slightly worse. Still lives at home. Son helps her with yard work. She fell a few months ago, and had right wrist fracture.   UPDATE 06/28/13: Since last visit, more knee/hip/back pain. Restless legs slightly worse. Now with tremor (with handwriting). More balance difficulty. Also with some shortness of breath and emphysematous changes on lung testing.  UPDATE 12/26/12: Since last visit, stable. Some intermittent HA, left sided. Some intermittent left facial numbness. Some memory loss. Got lost driving to this appt this AM, and had to call her daughter for directions.  Still lives alone (with dog). Daughter and son call daily to check on her.  PRIOR HPI (06/13/12, Dr. Erling Cruz):  81 year old right-handed white widowed female with a 3-1/2 year history of hesitancy in speech, using neologisms and making inappropriate comments, at times associated with a blank  stare. I saw her 12/08/2008 with normal neurologic examination, MMSE 25/30, CDT 4/4, AFT 10.  I started her on Aricept,venlafaxine was increased, and aspirin was continued. Evaluation included sedimentation rate, RPR, TSH, B12, and  CPK which were normal. MRI study of the brain 06/07/2008 was felt to be normal. CT scans of the brain 05/06/2007 and  10/12/2008 showed minimal small vessel disease and atrophy. EEG 12/13/2008 showed left-sided intermittent  slowing in the temporal  region. Repeat MRI study 03/04/2009 showed an asymmetry in the left temporal lobe versus the right, being larger, and having T1 signal alterations on the medial aspect and brighter signal in the left mesial temporal lobe on flair. 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 In review of the MRI 06/07/2008. Dr. Derrill Memo at [facility] performed a stereotactic left temporal  biopsy 06/13/2009 with no pathologic diagnosis of  tumor present.  There was astrocytic hypertrophy and there were "hypertensive vascular changes present".  She did not feel "generally well" without documented seizures. She complained of lack of energy.She was taken off of levetiracetam and  started on lamotrigine, progressing to 100 mg, one half twice daily. She states that she is confused at times with difficulty thinking of words. She denies auras of seizures. She occasionally stumbles. She does not notice language problems. Functionally she can cook, shop, clean, do laundry, make the bed and dishes, and mow the yard. She drives. She lives alone She has lost interest in traveling.  She has spells that come over her with ringing of her hands and  moving her feet and cannot  be still. These occur while sitting or at nighttime without loss of consciousness  and last 15 minutes. She treats for restless leg symptoms with Aleve.They usually occur when she is going to bed or sitting on the sofa.She occasionally has  left-sided headache.  She was  seen by Dr. Yetta Glassman 11/30/2011. Her MRI report 11/29/2011 was not changed in the left hypertrophied temporal lobe according to the patient.she is to see him in two years. She says her memory is okay. She enjoys socializing with a group of friends at [employer] having her breakfast. 06/13/2012=( MMSE 25/30. Clock drawing task 4/4.Animal fluency test 9. Ron Parker  Index of independence in activities of daily 6. Bubba Camp instrumental activities of daily living scale 8. Neuropsychiatric inventory=  Apathy 4. Geriatric depression scale 1/15.Falls assessment tool score 15).   REVIEW OF SYSTEMS: Full 14 system review of systems performed and notable only for fatigue nausea SOB.    ALLERGIES: No Known Allergies  HOME MEDICATIONS:  Outpatient Prescriptions Prior to Visit  Medication Sig Dispense Refill  . acetaminophen (TYLENOL) 325 MG tablet Take 650 mg by mouth every 6 (six) hours as needed for pain.    . calcium carbonate (TUMS EX) 750 MG chewable tablet Chew 1 tablet by mouth daily as needed for heartburn.    . Calcium Carbonate-Vitamin D (CALCIUM 600 + D PO) Take 1 tablet by mouth daily.     . DiphenhydrAMINE HCl, Sleep, (ZZZQUIL PO) Take by mouth.    . lamoTRIgine (LAMICTAL) 100 MG tablet One full tab in the a.m., one half tab at night (150mg  total daily dose) (Patient taking differently: Take 100 mg by mouth daily. One full tab in the a.m., one half tab at night (150mg  total daily dose)) 135 tablet 2  . metoprolol succinate (TOPROL-XL) 25 MG 24 hr tablet Take 25 mg by mouth daily.  4  . rOPINIRole (REQUIP) 1 MG tablet Take 1 tablet (1 mg total) by mouth 2 (two) times daily. 180 tablet 4  . venlafaxine (EFFEXOR) 37.5 MG tablet Take 37.5 mg by mouth daily.  4  . metoprolol tartrate (LOPRESSOR) 25 MG tablet Take 25 mg by mouth daily.     No facility-administered medications prior to visit.    PAST MEDICAL HISTORY: Past Medical History  Diagnosis Date  .  Hypertension   . Dysrhythmia     "my heart skips some beats"  . Anxiety   . Shortness of breath     /w exertion   . Brain tumor (benign) 2010    has consulted /w Dr. Tommi Rumps at [facility], AUG 2013- had MRI  . GERD (gastroesophageal reflux disease)   . H/O hiatal hernia   . Seizures     prior to diagnosed /w brain tumor, on lamictal since    . Arthritis     L arm, hips & knees - recently had injection in R  knee - Dr. Lorene Dy   . Headache(784.0)     pt. believes the recent headaches are related to "brain tumor", states she has a followup appt. wuith Neurologist in Sept.    . High cholesterol     PAST SURGICAL HISTORY: Past Surgical History  Procedure Laterality Date  . Abdominal hysterectomy  1990s  . Brain biopsy  2010    L temple  . Bunionectomy      left foot  . Breast biopsy Right 12/12/2012    Procedure: Right breast wire guided excision;  Surgeon: Rolm Bookbinder, MD;  Location: [facility] OR;  Service: General;  Laterality: Right;  . Knee surgery Left 2006    FAMILY HISTORY: Family History  Problem Relation Age of Onset  . Heart disease Mother   . Heart attack Mother   . Cancer Daughter     breast mets to lung  . Diabetes Son   . Colon cancer Father     SOCIAL HISTORY:  History   Social History  . Marital Status: Widowed    Spouse Name: N/A  . Number of Children: 2  . Years of Education: College   Occupational History  . Retired    Social History Main Topics  . Smoking status: Never Smoker   . Smokeless tobacco: Never Used  . Alcohol Use: No  . Drug Use: No  . Sexual Activity: Not on file   Other Topics Concern  . Not on file   Social History Narrative   Patient lives at home alone with son part time   Caffeine Use: 1/2 cup a day     PHYSICAL EXAM  Filed Vitals:   08/28/14 1423  BP: 157/84  Pulse: 69  Height: 5\' 2"  (1.575 m)  Weight: 158 lb 4.8 oz (71.804 kg)    Not recorded     MMSE - Mini Mental State Exam 05/29/2014  Orientation  to time 5  Orientation to Place 5  Registration 3  Attention/ Calculation 2  Recall 3  Language- name 2 objects 2  Language- repeat 0  Language- follow 3 step command 2  Language- read & follow direction 1  Write a sentence 1  Copy design 0  Total score 24     Body mass index is 28.95 kg/(m^2).  GENERAL EXAM: Patient is in no distress   CARDIOVASCULAR: Regular rate and rhythm, no murmurs, no carotid bruits  NEUROLOGIC: MENTAL STATUS: awake, alert, language fluent, comprehension intact, naming intact; POSITIVE MYERSONS, BORDERLINE SNOUT, POSITIVE PALMOMENTAL.  CRANIAL NERVE: pupils equal and reactive to light, visual fields full to confrontation, extraocular muscles intact, no nystagmus, facial sensation and strength symmetric, uvula midline, shoulder shrug symmetric, tongue midline. MILD VOICE TREMOR. MOTOR: normal bulk; MILD COGWHEELING IN LUE WITH CONTRALATERAL REINFORCEMENT, MILD APRAXIA IN BUE, MODERATE BRADYKINESIA IN LUE > RUE; LLE > RLE. MILD POSTURAL TREMOR. Full strength in the BUE, BLE. SENSORY: normal and symmetric to light touch COORDINATION: finger-nose-finger, fine finger movements SLOW. REFLEXES: deep tendon reflexes TRACE AND SYMM. GAIT/STATION: narrow based gait; SLOW TO STAND. UNSTEADY GAIT. DECR ARM SWING. STOOPED POSTURE.     DIAGNOSTIC DATA (LABS, IMAGING, TESTING) - I reviewed patient records, labs, notes, testing and imaging myself where available.  Lab Results  Component Value Date   WBC 6.8 12/05/2012   HGB 15.5* 12/05/2012   HCT 45.4 12/05/2012   MCV 96.8 12/05/2012   PLT 202 12/05/2012      Component Value Date/Time   NA 138 12/05/2012 1301   K 4.2 12/05/2012 1301   CL 103 12/05/2012 1301   CO2 28 12/05/2012 1301   GLUCOSE 75 12/05/2012 1301   BUN 11 12/05/2012 1301   CREATININE 0.66 12/05/2012 1301   CALCIUM 9.8 12/05/2012 1301   GFRNONAA 79* 12/05/2012 1301   GFRAA >90 12/05/2012 1301   No results found for: CHOL No results found  for: HGBA1C No results found for: VITAMINB12 No results found for: TSH  06/29/12 MRI brain (with and without contrast) demonstrating: 1. Slight asymmetry of the mesial temporal lobes, with the left  side slightly larger than the right. 2. Mild perisylvian and right mesial temporal atrophy.  3. Mild periventricular and subcortical non-specific foci of gliosis, likely chronic small vessel ischemic disease. 4. Mucosal thickening and bubbly secretions in the ethmoid, frontal and maxillary sinuses. 5. Compared to prior MRI from 03/04/09 there is no significant interval change.   ASSESSMENT AND PLAN  81 y.o. year old female here with history of complex partial seizures, abnormal MRI brain (s/p left temporal lobe biopsy --> astrocytic hypertrophy and hypertensive vascular changes). Not clearly neoplastic. Also with RLS on ropinirole. Also with parkinsonian features and memory loss. Could be parkinson's disease dementia vs dementia with lewy bodies.   PLAN: - Continue lamotrigine for seizure prevention - Continue ropinirole to 1mg  BID for RLS + parkinsonian features - may consider donepezil or Exelon at next visit  Return in about 3 months (around 11/28/2014).  I spent 15 minutes of face to face time with patient. Greater than 50% of time was spent in counseling and coordination of care with patient.     Penni Bombard, MD 7/58/8325, 4:98 PM Certified in Neurology, Neurophysiology and Neuroimaging  [facility] [address], [place], [place] [address] [phone]

## 2014-08-28 NOTE — Patient Instructions (Signed)
Continue current medications. 

## 2014-10-15 ENCOUNTER — Other Ambulatory Visit: Payer: Self-pay

## 2014-11-29 ENCOUNTER — Ambulatory Visit (INDEPENDENT_AMBULATORY_CARE_PROVIDER_SITE_OTHER): Payer: Medicare Other | Admitting: Diagnostic Neuroimaging

## 2014-11-29 ENCOUNTER — Encounter: Payer: Self-pay | Admitting: Diagnostic Neuroimaging

## 2014-11-29 VITALS — BP 145/73 | HR 70 | Ht 62.0 in | Wt 159.0 lb

## 2014-11-29 DIAGNOSIS — G20 Parkinson's disease: Secondary | ICD-10-CM | POA: Diagnosis not present

## 2014-11-29 DIAGNOSIS — G2581 Restless legs syndrome: Secondary | ICD-10-CM | POA: Diagnosis not present

## 2014-11-29 DIAGNOSIS — G40209 Localization-related (focal) (partial) symptomatic epilepsy and epileptic syndromes with complex partial seizures, not intractable, without status epilepticus: Secondary | ICD-10-CM | POA: Diagnosis not present

## 2014-11-29 DIAGNOSIS — R930 Abnormal findings on diagnostic imaging of skull and head, not elsewhere classified: Secondary | ICD-10-CM

## 2014-11-29 DIAGNOSIS — R9089 Other abnormal findings on diagnostic imaging of central nervous system: Secondary | ICD-10-CM

## 2014-11-29 MED ORDER — ROPINIROLE HCL 1 MG PO TABS: 1.0000 mg | ORAL_TABLET | Freq: Two times a day (BID) | ORAL | Status: DC

## 2014-11-29 NOTE — Progress Notes (Signed)
[facility]  PATIENT: Carrie Short DOB: [birth year]  REFERRING CLINICIAN:  HISTORY FROM: patient and daughter  REASON FOR VISIT: follow up   HISTORICAL  CHIEF COMPLAINT:  Chief Complaint  Patient presents with  . Tremors    RM 7 Follow up - MMSE -27/ 30   . Restless leg syndrome   . Seizures    HISTORY OF PRESENT ILLNESS:   UPDATE 11/28/14: Doing well. On nightly ropinirole 1mg  qhs. 5-10 days per month, takes an extra tab as needed for breakthrough tremors. Overall stable. Has inverted sleep schedule. No seizures. Still has occ trips and falls.   UPDATE 08/28/14: Since last visit, has transitioned away from driving. Now son stays with her at night. No sz. Doing well on ropinirole. Having more nausea, and PCP advised to try stopping ropinirole. No benefit with nausea. Now pt back on ropinirole.  UPDATE 05/29/14: since last visit, has been using daily ropinirole 1mg  5x per month, and continues every nightly 1mg  dose. Some benefit with tremor. Also tried PT, with some benefit. Not using walker. Daughter concerned about memory loss and pt ability to drive. Daughter reports pt gets lost when driving, suddenly pulls out in front of other cars, and is not safe. Patient also concerned about left supra-orbital swelling and headache.  UPDATE 12/27/13: Since last visit, more tremor, more gait diff. No seizures. Memory loss slightly worse. Still lives at home. Son helps her with yard work. She fell a few months ago, and had right wrist fracture.   UPDATE 06/28/13: Since last visit, more knee/hip/back pain. Restless legs slightly worse. Now with tremor (with handwriting). More balance difficulty. Also with some shortness of breath and emphysematous changes on lung testing.  UPDATE 12/26/12: Since last visit, stable. Some intermittent HA, left sided. Some intermittent left facial numbness. Some memory loss. Got lost driving to this appt this AM, and had to call her daughter for  directions.  Still lives alone (with dog). Daughter and son call daily to check on her.  PRIOR HPI (06/13/12, Dr. Erling Cruz):  81 year old right-handed white widowed female with a 3-1/2 year history of hesitancy in speech, using neologisms and making inappropriate comments, at times associated with a blank stare. I saw her 12/08/2008 with normal neurologic examination, MMSE 25/30, CDT 4/4, AFT 10.  I started her on Aricept,venlafaxine was increased, and aspirin was continued. Evaluation included sedimentation rate, RPR, TSH, B12, and  CPK which were normal. MRI study of the brain 06/07/2008 was felt to be normal. CT scans of the brain 05/06/2007 and  10/12/2008 showed minimal small vessel disease and atrophy. EEG 12/13/2008 showed left-sided intermittent  slowing in the temporal  region. Repeat MRI study 03/04/2009 showed an asymmetry in the left temporal lobe versus the right, being larger, and having T1 signal alterations on the medial aspect and brighter signal in the left mesial temporal lobe on flair. 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 In review of the MRI 06/07/2008. Dr. Derrill Memo at [facility] performed a stereotactic left temporal  biopsy 06/13/2009 with no pathologic diagnosis of  tumor present.  There was astrocytic hypertrophy and there were "hypertensive vascular changes present".  She did not feel "generally well" without documented seizures. She complained of lack of energy.She was taken off of levetiracetam and  started on lamotrigine, progressing to 100 mg, one half twice daily. She states that she is confused at times with difficulty thinking  of words. She denies auras of seizures. She occasionally stumbles. She does not notice language problems. Functionally she can cook, shop, clean, do laundry, make the bed and dishes, and mow the yard. She drives. She lives alone She has lost interest in traveling.  She has spells that come over her  with ringing of her hands and moving her feet and cannot  be still. These occur while sitting or at nighttime without loss of consciousness  and last 15 minutes. She treats for restless leg symptoms with Aleve.They usually occur when she is going to bed or sitting on the sofa.She occasionally has left-sided headache.  She was  seen by Dr. Yetta Glassman 11/30/2011. Her MRI report 11/29/2011 was not changed in the left hypertrophied temporal lobe according to the patient.she is to see him in two years. She says her memory is okay. She enjoys socializing with a group of friends at [employer] having her breakfast. 06/13/2012=( MMSE 25/30. Clock drawing task 4/4.Animal fluency test 9. Ron Parker  Index of independence in activities of daily 6. Bubba Camp instrumental activities of daily living scale 8. Neuropsychiatric inventory=  Apathy 4. Geriatric depression scale 1/15.Falls assessment tool score 15).   REVIEW OF SYSTEMS: Full 14 system review of systems performed and notable only for hearing loss shortness of breath diarrhea restless leg insomnia memory loss seizure tremors.    ALLERGIES: No Known Allergies  HOME MEDICATIONS:  Outpatient Prescriptions Prior to Visit  Medication Sig Dispense Refill  . calcium carbonate (TUMS EX) 750 MG chewable tablet Chew 1 tablet by mouth daily as needed for heartburn.    . Calcium Carbonate-Vitamin D (CALCIUM 600 + D PO) Take 1 tablet by mouth daily.     . diazepam (VALIUM) 2 MG tablet Take 2 mg by mouth every 6 (six) hours as needed for anxiety.    . DiphenhydrAMINE HCl, Sleep, (ZZZQUIL PO) Take by mouth.    . esomeprazole (NEXIUM) 20 MG capsule Take 20 mg by mouth daily at 12 noon.    . lamoTRIgine (LAMICTAL) 100 MG tablet One full tab in the a.m., one half tab at night (150mg  total daily dose) 135 tablet 4  . LORazepam (ATIVAN) 1 MG tablet Take 1 mg by mouth every 8 (eight) hours.    . metoprolol succinate (TOPROL-XL) 25 MG 24 hr tablet Take 25 mg by mouth daily.  4   . rOPINIRole (REQUIP) 1 MG tablet Take 1 tablet (1 mg total) by mouth 2 (two) times daily. 180 tablet 4  . venlafaxine (EFFEXOR) 37.5 MG tablet Take 37.5 mg by mouth daily.  4  . acetaminophen (TYLENOL) 325 MG tablet Take 650 mg by mouth every 6 (six) hours as needed for pain.     No facility-administered medications prior to visit.    PAST MEDICAL HISTORY: Past Medical History  Diagnosis Date  . Hypertension   . Dysrhythmia     "my heart skips some beats"  . Anxiety   . Shortness of breath     /w exertion   . Brain tumor (benign) 2010    has consulted /w Dr. Tommi [facility]umps at [facility], AUG 2013- had MRI  . GERD (gastroesophageal reflux disease)   . H/O hiatal hernia   . Seizures     prior to diagnosed /w brain tumor, on lamictal since    . Arthritis     L arm, hips & knees - recently had injection in R  knee - Dr. Lorene Dy   . Headache(784.0)  pt. believes the recent headaches are related to "brain tumor", states she has a followup appt. wuith Neurologist in Sept.    . High cholesterol     PAST SURGICAL HISTORY: Past Surgical History  Procedure Laterality Date  . Abdominal hysterectomy  1990s  . Brain biopsy  2010    L temple  . Bunionectomy      left foot  . Breast biopsy Right 12/12/2012    Procedure: Right breast wire guided excision;  Surgeon: Rolm Bookbinder, MD;  Location: [facility] OR;  Service: General;  Laterality: Right;  . Knee surgery Left 2006    FAMILY HISTORY: Family History  Problem Relation Age of Onset  . Heart disease Mother   . Heart attack Mother   . Cancer Daughter     breast mets to lung  . Diabetes Son   . Colon cancer Father     SOCIAL HISTORY:  Social History   Social History  . Marital Status: Widowed    Spouse Name: N/A  . Number of Children: 2  . Years of Education: College   Occupational History  . Retired    Social History Main Topics  . Smoking status: Never Smoker   . Smokeless tobacco: Never Used  . Alcohol Use: No  .  Drug Use: No  . Sexual Activity: Not on file   Other Topics Concern  . Not on file   Social History Narrative   Patient lives at home alone with son part time   Caffeine Use: 1/2 cup a day     PHYSICAL EXAM  Filed Vitals:   11/29/14 1339  BP: 145/73  Pulse: 70  Height: 5\' 2"  (1.575 m)  Weight: 159 lb (72.122 kg)    Not recorded     MMSE - Mini Mental State Exam 11/29/2014 05/29/2014  Orientation to time 5 5  Orientation to Place 5 5  Registration 3 3  Attention/ Calculation 5 2  Recall 0 3  Language- name 2 objects 2 2  Language- repeat 1 0  Language- follow 3 step command 3 2  Language- read & follow direction 1 1  Write a sentence 1 1  Copy design 1 0  Total score 27 24     Body mass index is 29.07 kg/(m^2).  GENERAL EXAM: Patient is in no distress   CARDIOVASCULAR: Regular rate and rhythm, no murmurs, no carotid bruits  NEUROLOGIC: MENTAL STATUS: awake, alert, language fluent, comprehension intact, naming intact; POSITIVE MYERSONS, BORDERLINE SNOUT, POSITIVE PALMOMENTAL.  CRANIAL NERVE: pupils equal and reactive to light, visual fields full to confrontation, extraocular muscles intact, no nystagmus, facial sensation and strength symmetric, uvula midline, shoulder shrug symmetric, tongue midline. MILD VOICE TREMOR. MOTOR: normal bulk; MILD COGWHEELING IN LUE WITH CONTRALATERAL REINFORCEMENT, MILD APRAXIA IN BUE, MODERATE BRADYKINESIA IN LUE > RUE; LLE > RLE.  Full strength in the BUE, BLE. SENSORY: normal and symmetric to light touch COORDINATION: finger-nose-finger, fine finger movements SLOW. REFLEXES: deep tendon reflexes TRACE AND SYMM. GAIT/STATION: narrow based gait; SLOW TO STAND. UNSTEADY GAIT. DECR ARM SWING. STOOPED POSTURE.     DIAGNOSTIC DATA (LABS, IMAGING, TESTING) - I reviewed patient records, labs, notes, testing and imaging myself where available.  Lab Results  Component Value Date   WBC 6.8 12/05/2012   HGB 15.5* 12/05/2012   HCT 45.4  12/05/2012   MCV 96.8 12/05/2012   PLT 202 12/05/2012      Component Value Date/Time   NA 138 12/05/2012 1301   K  4.2 12/05/2012 1301   CL 103 12/05/2012 1301   CO2 28 12/05/2012 1301   GLUCOSE 75 12/05/2012 1301   BUN 11 12/05/2012 1301   CREATININE 0.66 12/05/2012 1301   CALCIUM 9.8 12/05/2012 1301   GFRNONAA 79* 12/05/2012 1301   GFRAA >90 12/05/2012 1301   No results found for: CHOL No results found for: HGBA1C No results found for: VITAMINB12 No results found for: TSH  06/29/12 MRI brain (with and without contrast) demonstrating: 1. Slight asymmetry of the mesial temporal lobes, with the left side slightly larger than the right. 2. Mild perisylvian and right mesial temporal atrophy.  3. Mild periventricular and subcortical non-specific foci of gliosis, likely chronic small vessel ischemic disease. 4. Mucosal thickening and bubbly secretions in the ethmoid, frontal and maxillary sinuses. 5. Compared to prior MRI from 03/04/09 there is no significant interval change.   ASSESSMENT AND PLAN  81 y.o. year old female here with history of complex partial seizures, abnormal MRI brain (s/p left temporal lobe biopsy --> astrocytic hypertrophy and hypertensive vascular changes). Not clearly neoplastic. Also with RLS on ropinirole. Also with parkinsonian features and memory loss (stable).   Dx:   Parkinsonism  Restless leg syndrome  Partial symptomatic epilepsy with complex partial seizures, not intractable, without status epilepticus  MRI of brain abnormal    PLAN: - Continue lamotrigine for seizure prevention - Continue ropinirole to 1mg  BID for RLS + parkinsonian features - Use cane or walker  Return in about 6 months (around 06/01/2015).     Penni Bombard, MD 12/12/2351, 6:14 PM Certified in Neurology, Neurophysiology and Neuroimaging  [facility] [address], [place], [place] [address] [phone]

## 2015-06-06 ENCOUNTER — Ambulatory Visit (INDEPENDENT_AMBULATORY_CARE_PROVIDER_SITE_OTHER): Payer: Medicare Other | Admitting: Diagnostic Neuroimaging

## 2015-06-06 ENCOUNTER — Encounter: Payer: Self-pay | Admitting: Diagnostic Neuroimaging

## 2015-06-06 VITALS — BP 174/71 | HR 54 | Ht 62.0 in | Wt 161.8 lb

## 2015-06-06 DIAGNOSIS — G20 Parkinson's disease: Secondary | ICD-10-CM | POA: Diagnosis not present

## 2015-06-06 DIAGNOSIS — R413 Other amnesia: Secondary | ICD-10-CM | POA: Diagnosis not present

## 2015-06-06 DIAGNOSIS — R9089 Other abnormal findings on diagnostic imaging of central nervous system: Secondary | ICD-10-CM

## 2015-06-06 DIAGNOSIS — G20C Parkinsonism, unspecified: Secondary | ICD-10-CM

## 2015-06-06 DIAGNOSIS — R930 Abnormal findings on diagnostic imaging of skull and head, not elsewhere classified: Secondary | ICD-10-CM | POA: Diagnosis not present

## 2015-06-06 MED ORDER — LAMOTRIGINE 100 MG PO TABS: 100.0000 mg | ORAL_TABLET | Freq: Every day | ORAL | Status: DC

## 2015-06-06 MED ORDER — ROPINIROLE HCL 1 MG PO TABS: 1.0000 mg | ORAL_TABLET | Freq: Two times a day (BID) | ORAL | Status: DC

## 2015-06-06 NOTE — Progress Notes (Signed)
[facility]  PATIENT: Carrie Short DOB: 1927-10-12  REFERRING CLINICIAN:  HISTORY FROM: patient REASON FOR VISIT: follow up   HISTORICAL  CHIEF COMPLAINT:  Chief Complaint  Patient presents with  . Follow-up    Room 6 alone.   . Tremors    HISTORY OF PRESENT ILLNESS:   UPDATE 06/06/15: Since last visit, doing well. No sz. Continues on ropinirole. Memory loss issues stable. Still feels unsteady with walking.   UPDATE 11/28/14: Doing well. On nightly ropinirole 1mg  qhs. 5-10 days per month, takes an extra tab as needed for breakthrough tremors. Overall stable. Has inverted sleep schedule. No seizures. Still has occ trips and falls.   UPDATE 08/28/14: Since last visit, has transitioned away from driving. Now son stays with her at night. No sz. Doing well on ropinirole. Having more nausea, and PCP advised to try stopping ropinirole. No benefit with nausea. Now pt back on ropinirole.  UPDATE 05/29/14: since last visit, has been using daily ropinirole 1mg  5x per month, and continues every nightly 1mg  dose. Some benefit with tremor. Also tried PT, with some benefit. Not using walker. Daughter concerned about memory loss and pt ability to drive. Daughter reports pt gets lost when driving, suddenly pulls out in front of other cars, and is not safe. Patient also concerned about left supra-orbital swelling and headache.  UPDATE 12/27/13: Since last visit, more tremor, more gait diff. No seizures. Memory loss slightly worse. Still lives at home. Son helps her with yard work. She fell a few months ago, and had right wrist fracture.   UPDATE 06/28/13: Since last visit, more knee/hip/back pain. Restless legs slightly worse. Now with tremor (with handwriting). More balance difficulty. Also with some shortness of breath and emphysematous changes on lung testing.  UPDATE 12/26/12: Since last visit, stable. Some intermittent HA, left sided. Some intermittent left facial numbness. Some  memory loss. Got lost driving to this appt this AM, and had to call her daughter for directions.  Still lives alone (with dog). Daughter and son call daily to check on her.  PRIOR HPI (06/13/12, Dr. Erling Cruz):  82 year old right-handed white widowed female with a 3-1/2 year history of hesitancy in speech, using neologisms and making inappropriate comments, at times associated with a blank stare. I saw her 12/08/2008 with normal neurologic examination, MMSE 25/30, CDT 4/4, AFT 10.  I started her on Aricept,venlafaxine was increased, and aspirin was continued. Evaluation included sedimentation rate, RPR, TSH, B12, and  CPK which were normal. MRI study of the brain 06/07/2008 was felt to be normal. CT scans of the brain 05/06/2007 and  10/12/2008 showed minimal small vessel disease and atrophy. EEG 12/13/2008 showed left-sided intermittent  slowing in the temporal  region. Repeat MRI study 03/04/2009 showed an asymmetry in the left temporal lobe versus the right, being larger, and having T1 signal alterations on the medial aspect and brighter signal in the left mesial temporal lobe on flair. 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 In review of the MRI 06/07/2008. Dr. Derrill Memo at [facility] performed a stereotactic left temporal  biopsy 06/13/2009 with no pathologic diagnosis of  tumor present.  There was astrocytic hypertrophy and there were "hypertensive vascular changes present".  She did not feel "generally well" without documented seizures. She complained of lack of energy.She was taken off of levetiracetam and  started on lamotrigine, progressing to 100 mg, one half twice daily. She states  that she is confused at times with difficulty thinking of words. She denies auras of seizures. She occasionally stumbles. She does not notice language problems. Functionally she can cook, shop, clean, do laundry, make the bed and dishes, and mow the yard. She drives. She  lives alone She has lost interest in traveling.  She has spells that come over her with ringing of her hands and moving her feet and cannot  be still. These occur while sitting or at nighttime without loss of consciousness  and last 15 minutes. She treats for restless leg symptoms with Aleve.They usually occur when she is going to bed or sitting on the sofa.She occasionally has left-sided headache.  She was  seen by Dr. Yetta Glassman 11/30/2011. Her MRI report 11/29/2011 was not changed in the left hypertrophied temporal lobe according to the patient.she is to see him in two years. She says her memory is okay. She enjoys socializing with a group of friends at [employer] having her breakfast. 06/13/2012=( MMSE 25/30. Clock drawing task 4/4.Animal fluency test 9. Ron Parker  Index of independence in activities of daily 6. Bubba Camp instrumental activities of daily living scale 8. Neuropsychiatric inventory=  Apathy 4. Geriatric depression scale 1/15.Falls assessment tool score 15).   REVIEW OF SYSTEMS: Full 14 system review of systems performed and notable only for hearing loss shortness of breath diarrhea restless leg insomnia memory loss seizure tremors.    ALLERGIES: No Known Allergies  HOME MEDICATIONS:  Outpatient Prescriptions Prior to Visit  Medication Sig Dispense Refill  . Calcium Carbonate-Vitamin D (CALCIUM 600 + D PO) Take 1 tablet by mouth daily.     . diazepam (VALIUM) 2 MG tablet Take 2 mg by mouth every 6 (six) hours as needed for anxiety.    . lamoTRIgine (LAMICTAL) 100 MG tablet One full tab in the a.m., one half tab at night (150mg  total daily dose) 135 tablet 4  . LORazepam (ATIVAN) 1 MG tablet Take 1 mg by mouth every 8 (eight) hours as needed.     . Naproxen Sodium (ALEVE PO) Take by mouth as needed.    Marland Kitchen rOPINIRole (REQUIP) 1 MG tablet Take 1 tablet (1 mg total) by mouth 2 (two) times daily. 180 tablet 4  . venlafaxine (EFFEXOR) 37.5 MG tablet Take 37.5 mg by mouth daily.  4  .  metoprolol succinate (TOPROL-XL) 25 MG 24 hr tablet Take 50 mg by mouth daily.   4  . calcium carbonate (TUMS EX) 750 MG chewable tablet Chew 1 tablet by mouth daily as needed for heartburn.    . DiphenhydrAMINE HCl, Sleep, (ZZZQUIL PO) Take by mouth.    . esomeprazole (NEXIUM) 20 MG capsule Take 20 mg by mouth daily at 12 noon.     No facility-administered medications prior to visit.    PAST MEDICAL HISTORY: Past Medical History  Diagnosis Date  . Hypertension   . Dysrhythmia     "my heart skips some beats"  . Anxiety   . Shortness of breath     /w exertion   . Brain tumor (benign) ([place]) 2010    has consulted /w Dr. Tommi Rumps at [facility], AUG 2013- had MRI  . GERD (gastroesophageal reflux disease)   . H/O hiatal hernia   . Seizures ([place])     prior to diagnosed /w brain tumor, on lamictal since    . Arthritis     L arm, hips & knees - recently had injection in R  knee - Dr. Jori Moll  Mancel Bale   . Headache(784.0)     pt. believes the recent headaches are related to "brain tumor", states she has a followup appt. wuith Neurologist in Sept.    . High cholesterol     PAST SURGICAL HISTORY: Past Surgical History  Procedure Laterality Date  . Abdominal hysterectomy  1990s  . Brain biopsy  2010    L temple  . Bunionectomy      left foot  . Breast biopsy Right 12/12/2012    Procedure: Right breast wire guided excision;  Surgeon: Rolm Bookbinder, MD;  Location: [facility] OR;  Service: General;  Laterality: Right;  . Knee surgery Left 2006    FAMILY HISTORY: Family History  Problem Relation Age of Onset  . Heart disease Mother   . Heart attack Mother   . Cancer Daughter     breast mets to lung  . Diabetes Son   . Colon cancer Father     SOCIAL HISTORY:  Social History   Social History  . Marital Status: Widowed    Spouse Name: N/A  . Number of Children: 2  . Years of Education: College   Occupational History  . Retired    Social History Main Topics  . Smoking status: Never  Smoker   . Smokeless tobacco: Never Used  . Alcohol Use: No  . Drug Use: No  . Sexual Activity: Not on file   Other Topics Concern  . Not on file   Social History Narrative   Patient lives at home alone with son part time   Caffeine Use: 1/2 cup a day     PHYSICAL EXAM  Filed Vitals:   06/06/15 1405  BP: 174/71  Pulse: 54  Height: 5\' 2"  (1.575 m)  Weight: 161 lb 12.8 oz (73.392 kg)    Not recorded     MMSE - Mini Mental State Exam 06/06/2015 11/29/2014 05/29/2014  Orientation to time 5 5 5   Orientation to Place 5 5 5   Registration 3 3 3   Attention/ Calculation 5 5 2   Recall 2 0 3  Language- name 2 objects 2 2 2   Language- repeat 1 1 0  Language- follow 3 step command 3 3 2   Language- read & follow direction 1 1 1   Write a sentence 1 1 1   Copy design 1 1 0  Total score 29 27 24      Body mass index is 29.59 kg/(m^2).  GENERAL EXAM: Patient is in no distress   CARDIOVASCULAR: Regular rate and rhythm, no murmurs, no carotid bruits  NEUROLOGIC: MENTAL STATUS: awake, alert, language fluent, comprehension intact, naming intact; POSITIVE MYERSONS, BORDERLINE SNOUT, POSITIVE PALMOMENTAL.  CRANIAL NERVE: pupils equal and reactive to light, visual fields full to confrontation, extraocular muscles intact, no nystagmus, facial sensation and strength symmetric, uvula midline, shoulder shrug symmetric, tongue midline. MILD VOICE TREMOR. MOTOR: normal bulk; MILD COGWHEELING IN LUE WITH CONTRALATERAL REINFORCEMENT, MILD APRAXIA IN BUE, MODERATE BRADYKINESIA IN LUE > RUE; LLE > RLE.  Full strength in the BUE, BLE. SENSORY: normal and symmetric to light touch COORDINATION: finger-nose-finger, fine finger movements SLOW. REFLEXES: deep tendon reflexes TRACE AND SYMM. GAIT/STATION: narrow based gait; SLOW TO STAND. UNSTEADY GAIT. DECR ARM SWING. STOOPED POSTURE.     DIAGNOSTIC DATA (LABS, IMAGING, TESTING) - I reviewed patient records, labs, notes, testing and imaging myself where  available.  Lab Results  Component Value Date   WBC 6.8 12/05/2012   HGB 15.5* 12/05/2012   HCT 45.4 12/05/2012   MCV  96.8 12/05/2012   PLT 202 12/05/2012      Component Value Date/Time   NA 138 12/05/2012 1301   K 4.2 12/05/2012 1301   CL 103 12/05/2012 1301   CO2 28 12/05/2012 1301   GLUCOSE 75 12/05/2012 1301   BUN 11 12/05/2012 1301   CREATININE 0.66 12/05/2012 1301   CALCIUM 9.8 12/05/2012 1301   GFRNONAA 79* 12/05/2012 1301   GFRAA >90 12/05/2012 1301   No results found for: CHOL No results found for: HGBA1C No results found for: VITAMINB12 No results found for: TSH  06/29/12 MRI brain (with and without contrast) demonstrating: 1. Slight asymmetry of the mesial temporal lobes, with the left side slightly larger than the right. 2. Mild perisylvian and right mesial temporal atrophy.  3. Mild periventricular and subcortical non-specific foci of gliosis, likely chronic small vessel ischemic disease. 4. Mucosal thickening and bubbly secretions in the ethmoid, frontal and maxillary sinuses. 5. Compared to prior MRI from 03/04/09 there is no significant interval change.   ASSESSMENT AND PLAN  82 y.o. year old female here with history of complex partial seizures, abnormal MRI brain (s/p left temporal lobe biopsy --> astrocytic hypertrophy and hypertensive vascular changes). Not clearly neoplastic. Also with RLS on ropinirole. Also with parkinsonian features and memory loss (stable).   Dx:   Parkinsonism, unspecified Parkinsonism type ([place])  MRI of brain abnormal     PLAN: - Continue lamotrigine for seizure prevention - Continue ropinirole to 1mg  BID for RLS + parkinsonian features - Use cane or walker  Meds ordered this encounter  Medications  . rOPINIRole (REQUIP) 1 MG tablet    Sig: Take 1 tablet (1 mg total) by mouth 2 (two) times daily.    Dispense:  180 tablet    Refill:  4  . lamoTRIgine (LAMICTAL) 100 MG tablet    Sig: Take 1 tablet (100 mg total) by  mouth daily. One full tab in the a.m., one half tab at night (150mg  total daily dose)    Dispense:  90 tablet    Refill:  4   Return in about 6 months (around 12/04/2015).     Penni Bombard, MD [id number], [id number] PM Certified in Neurology, Neurophysiology and Neuroimaging  [facility] [address], [place], [place] [address] [phone]

## 2015-06-06 NOTE — Patient Instructions (Signed)
Thank you for coming to see [place] at [facility]. I hope we have been able to provide you high quality care today.  You may receive a patient satisfaction survey over the next few weeks. We would appreciate your feedback and comments so that we may continue to improve ourselves and the health of our patients.  - continue lamotrigine and ropinirole.   ~~~~~~~~~~~~~~~~~~~~~~~~~~~~~~~~~~~~~~~~~~~~~~~~~~~~~~~~~~~~~~~~~  DR. Aowyn Rozeboom'S GUIDE TO HAPPY AND HEALTHY LIVING These are some of my general health and wellness recommendations. Some of them may apply to you better than others. Please use common sense as you try these suggestions and feel free to ask me any questions.   ACTIVITY/FITNESS Mental, social, emotional and physical stimulation are very important for brain and body health. Try learning a new activity (arts, music, language, sports, games).  Keep moving your body to the best of your abilities. You can do this at home, inside or outside, the park, community center, gym or anywhere you like. Consider a physical therapist or personal trainer to get started. Consider the app Sworkit. Fitness trackers such as smart-watches, smart-phones or Fitbits can help as well.   NUTRITION Eat more plants: colorful vegetables, nuts, seeds and berries.  Eat less sugar, salt, preservatives and processed foods.  Avoid toxins such as cigarettes and alcohol.  Drink water when you are thirsty. Warm water with a slice of lemon is an excellent morning drink to start the day.  Consider these websites for more information The Nutrition Source (https://www.henry-hernandez.biz/) Precision Nutrition (WindowBlog.ch)   RELAXATION Consider practicing mindfulness meditation or other relaxation techniques such as deep breathing, prayer, yoga, tai chi, massage. See website mindful.org or the apps Headspace or Calm to help get started.   SLEEP Try  to get at least 7-8+ hours sleep per day. Regular exercise and reduced caffeine will help you sleep better. Practice good sleep hygeine techniques. See website sleep.org for more information.   PLANNING Prepare estate planning, living will, healthcare POA documents. Sometimes this is best planned with the help of an attorney. Theconversationproject.org and agingwithdignity.org are excellent resources.

## 2015-06-14 ENCOUNTER — Other Ambulatory Visit: Payer: Self-pay

## 2015-06-14 DIAGNOSIS — Z1231 Encounter for screening mammogram for malignant neoplasm of breast: Secondary | ICD-10-CM

## 2015-06-28 ENCOUNTER — Ambulatory Visit
Admission: RE | Admit: 2015-06-28 | Discharge: 2015-06-28 | Disposition: A | Discharge status: Home / Self Care | Payer: Medicare Other | Source: Ambulatory Visit

## 2015-06-28 DIAGNOSIS — Z1231 Encounter for screening mammogram for malignant neoplasm of breast: Secondary | ICD-10-CM

## 2015-10-16 ENCOUNTER — Ambulatory Visit: Payer: Medicare Other

## 2015-10-16 ENCOUNTER — Ambulatory Visit (INDEPENDENT_AMBULATORY_CARE_PROVIDER_SITE_OTHER): Payer: Medicare Other | Admitting: Family Medicine

## 2015-10-16 ENCOUNTER — Ambulatory Visit (INDEPENDENT_AMBULATORY_CARE_PROVIDER_SITE_OTHER): Payer: Medicare Other

## 2015-10-16 VITALS — BP 112/76 | HR 64 | Temp 98.2°F | Resp 18 | Ht 62.0 in | Wt 158.0 lb

## 2015-10-16 DIAGNOSIS — W19XXXA Unspecified fall, initial encounter: Secondary | ICD-10-CM | POA: Diagnosis not present

## 2015-10-16 DIAGNOSIS — M545 Low back pain: Secondary | ICD-10-CM

## 2015-10-16 DIAGNOSIS — R600 Localized edema: Secondary | ICD-10-CM

## 2015-10-16 DIAGNOSIS — S80812A Abrasion, left lower leg, initial encounter: Secondary | ICD-10-CM | POA: Diagnosis not present

## 2015-10-16 MED ORDER — MUPIROCIN 2 % EX OINT: 1.0000 "application " | TOPICAL_OINTMENT | Freq: Two times a day (BID) | CUTANEOUS | Status: DC

## 2015-10-16 NOTE — Progress Notes (Signed)
   HPI  Patient presents today here as a new patient after a fall 1 day ago  Pt explains that 1 day ago she fell between her bed and a chair in her room for an unknown reason. She had difficulty getting up but is walking easily today.   She has a large L leg laceration that her daughter brings her in for evaluation.   She has a recent Cross of parkinsonism with memory loss, She had a concerning lesion in the brain she states has been biopsied at [facility] and has ben told it is scar tissue  She c/o mild L side low back pain, she had L knee swelling yesterday that has resolved.   She cannot describe her fall and states that she is unsure what happened. She did not lose consciousness or hit her head.   PMH: Smoking status noted, also with Hx of seizures, RLS, Parkinsonism, and memory loss Surgical Hx- brain Bx, L knee surgery Family Hx- Mother with CAD Social- lives home alone.  ROS: Per HPI  Objective: BP 112/76 mmHg  Pulse 64  Temp(Src) 98.2 F (36.8 C) (Oral)  Resp 18  Ht 5\' 2"  (1.575 m)  Wt 158 lb (71.668 kg)  BMI 28.89 kg/m2  SpO2 98% Gen: NAD, alert, cooperative with exam HEENT: NCAT, EOMI, PERRL CV: RRR, good S1/S2, no murmur Resp: CTABL, no wheezes, non-labored Abd: SNTND, BS present, no guarding or organomegaly Ext: No edema, warm Neuro: Alert and oriented, No gross deficits  XRAYS: L knee Sclerosis nad mild joint space narrowing  Lumnbar L4 anterolisthesis Joint space narrowing in several areas  Assessment and plan:  # Fall Unclear mechanism, PLain films today are normal They will follow up with PCP within 2 weeks No concern for syncope Plain films non acute  # L leg abrasion/laceration Unable to close goiven 24 hours after injury Cleaned and dressed hear today Discussed usual wound care, mupirocin BID      Orders Placed This Encounter  Procedures  . DG Lumbar Spine 2-3 Views    Standing Status: Future     Number of Occurrences: 1     Standing  Expiration Date: 10/15/2016    Order Specific Question:  Reason for Exam (SYMPTOM  OR DIAGNOSIS REQUIRED)    Answer:  L knee swelling after a fall    Order Specific Question:  Preferred imaging location?    Answer:  External  . DG Knee 1-2 Views Left    Standing Status: Future     Number of Occurrences: 1     Standing Expiration Date: 12/15/2016    Order Specific Question:  Reason for Exam (SYMPTOM  OR DIAGNOSIS REQUIRED)    Answer:  L knee swelling after a fall    Order Specific Question:  Preferred imaging location?    Answer:  External     Laroy Apple, MD [facility] 10/16/2015, 1:43 PM

## 2015-10-16 NOTE — Patient Instructions (Addendum)
  Great to meet you!  Change the bandage twice daily, clean with tao water or saline, apply thin layer of mupirocin, and cover with Telfa pad (non adherant dressing) and coban.   Please follow up with your primary care doctor within 1-2 weeks.      IF you received an x-ray today, you will receive an invoice from [facility]. Please contact [facility] at [phone] with questions or concerns regarding your invoice.   IF you received labwork today, you will receive an invoice from [employer]. Please contact [facility] at [phone] with questions or concerns regarding your invoice.   Our billing staff will not be able to assist you with questions regarding bills from these companies.  You will be contacted with the lab results as soon as they are available. The fastest way to get your results is to activate your My Chart account. Instructions are located on the last page of this paperwork. If you have not heard from [place] regarding the results in 2 weeks, please contact this office.

## 2015-11-28 ENCOUNTER — Other Ambulatory Visit: Payer: Self-pay | Admitting: Diagnostic Neuroimaging

## 2015-12-09 ENCOUNTER — Telehealth: Payer: Self-pay | Admitting: *Deleted

## 2015-12-09 NOTE — Telephone Encounter (Signed)
Spoke with patient to reschedule her FU this Thurs due to provider being out of the office this Thurs. She requested another Thurs due to transportation issues. Rescheduled her for next Thurs; she verbalized understanding, agreement.

## 2015-12-11 ENCOUNTER — Telehealth: Payer: Self-pay | Admitting: *Deleted

## 2015-12-11 NOTE — Telephone Encounter (Signed)
Spoke with patient and informed her that her follow up needs to be moved. Apologized for moving it again, explained Dr Leta Baptist had a meeting added onto his schedule that afternoon. Rescheduled her for 12/26/15 at her request for transportation. She verbalized understanding, agreement.

## 2015-12-12 ENCOUNTER — Ambulatory Visit: Payer: Medicare Other | Admitting: Diagnostic Neuroimaging

## 2015-12-19 ENCOUNTER — Ambulatory Visit: Payer: Self-pay | Admitting: Diagnostic Neuroimaging

## 2015-12-26 ENCOUNTER — Encounter: Payer: Self-pay | Admitting: Diagnostic Neuroimaging

## 2015-12-26 ENCOUNTER — Ambulatory Visit (INDEPENDENT_AMBULATORY_CARE_PROVIDER_SITE_OTHER): Payer: Medicare Other | Admitting: Diagnostic Neuroimaging

## 2015-12-26 VITALS — BP 123/65 | HR 63 | Ht 62.0 in | Wt 162.0 lb

## 2015-12-26 DIAGNOSIS — G2581 Restless legs syndrome: Secondary | ICD-10-CM | POA: Diagnosis not present

## 2015-12-26 DIAGNOSIS — G20 Parkinson's disease: Secondary | ICD-10-CM

## 2015-12-26 DIAGNOSIS — G40209 Localization-related (focal) (partial) symptomatic epilepsy and epileptic syndromes with complex partial seizures, not intractable, without status epilepticus: Secondary | ICD-10-CM

## 2015-12-26 MED ORDER — LAMOTRIGINE 100 MG PO TABS: 100.0000 mg | ORAL_TABLET | Freq: Every day | ORAL | 4 refills | Status: DC

## 2015-12-26 MED ORDER — LAMOTRIGINE 100 MG PO TABS: ORAL_TABLET | ORAL | 12 refills | Status: DC

## 2015-12-26 MED ORDER — ROPINIROLE HCL 1 MG PO TABS: 1.0000 mg | ORAL_TABLET | Freq: Two times a day (BID) | ORAL | 4 refills | Status: DC

## 2015-12-26 NOTE — Patient Instructions (Addendum)
-   continue lamotrigine 100mg    - continue ropinirole 1mg  twice a day

## 2015-12-26 NOTE — Progress Notes (Signed)
[facility]  PATIENT: Carrie Short DOB: 05-Mar-1928  REFERRING CLINICIAN:  HISTORY FROM: patient REASON FOR VISIT: follow up   HISTORICAL  CHIEF COMPLAINT:  Chief Complaint  Patient presents with  . Other    rm 7 Parkinsonism, dgtr- Santiago Glad, "shakes are worse, something comes over me and I shake, Requip helps"  . Follow-up    6 month  . Memory Loss    MMSE 24    HISTORY OF PRESENT ILLNESS:   UPDATE 12/26/15: Since last visit, continues to fall. Not using a walker. Still mowing yard.   UPDATE 06/06/15: Since last visit, doing well. No sz. Continues on ropinirole. Memory loss issues stable. Still feels unsteady with walking.   UPDATE 11/28/14: Doing well. On nightly ropinirole 1mg  qhs. 5-10 days per month, takes an extra tab as needed for breakthrough tremors. Overall stable. Has inverted sleep schedule. No seizures. Still has occ trips and falls.   UPDATE 08/28/14: Since last visit, has transitioned away from driving. Now son stays with her at night. No sz. Doing well on ropinirole. Having more nausea, and PCP advised to try stopping ropinirole. No benefit with nausea. Now pt back on ropinirole.  UPDATE 05/29/14: since last visit, has been using daily ropinirole 1mg  5x per month, and continues every nightly 1mg  dose. Some benefit with tremor. Also tried PT, with some benefit. Not using walker. Daughter concerned about memory loss and pt ability to drive. Daughter reports pt gets lost when driving, suddenly pulls out in front of other cars, and is not safe. Patient also concerned about left supra-orbital swelling and headache.  UPDATE 12/27/13: Since last visit, more tremor, more gait diff. No seizures. Memory loss slightly worse. Still lives at home. Son helps her with yard work. She fell a few months ago, and had right wrist fracture.   UPDATE 06/28/13: Since last visit, more knee/hip/back pain. Restless legs slightly worse. Now with tremor (with handwriting). More  balance difficulty. Also with some shortness of breath and emphysematous changes on lung testing.  UPDATE 12/26/12: Since last visit, stable. Some intermittent HA, left sided. Some intermittent left facial numbness. Some memory loss. Got lost driving to this appt this AM, and had to call her daughter for directions.  Still lives alone (with dog). Daughter and son call daily to check on her.  PRIOR HPI (06/13/12, Dr. Erling Cruz):  82 year old right-handed white widowed female with a 3-1/2 year history of hesitancy in speech, using neologisms and making inappropriate comments, at times associated with a blank stare. I saw her 12/08/2008 with normal neurologic examination, MMSE 25/30, CDT 4/4, AFT 10.  I started her on Aricept,venlafaxine was increased, and aspirin was continued. Evaluation included sedimentation rate, RPR, TSH, B12, and  CPK which were normal. MRI study of the brain 06/07/2008 was felt to be normal. CT scans of the brain 05/06/2007 and  10/12/2008 showed minimal small vessel disease and atrophy. EEG 12/13/2008 showed left-sided intermittent  slowing in the temporal  region. Repeat MRI study 03/04/2009 showed an asymmetry in the left temporal lobe versus the right, being larger, and having T1 signal alterations on the medial aspect and brighter signal in the left mesial temporal lobe on flair. 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 In review of the MRI 06/07/2008. Dr. Derrill Memo at [facility] performed a stereotactic left temporal  biopsy 06/13/2009 with no pathologic diagnosis of  tumor present.  There was astrocytic  hypertrophy and there were "hypertensive vascular changes present".  She did not feel "generally well" without documented seizures. She complained of lack of energy.She was taken off of levetiracetam and  started on lamotrigine, progressing to 100 mg, one half twice daily. She states that she is confused at times with difficulty  thinking of words. She denies auras of seizures. She occasionally stumbles. She does not notice language problems. Functionally she can cook, shop, clean, do laundry, make the bed and dishes, and mow the yard. She drives. She lives alone She has lost interest in traveling.  She has spells that come over her with ringing of her hands and moving her feet and cannot  be still. These occur while sitting or at nighttime without loss of consciousness  and last 15 minutes. She treats for restless leg symptoms with Aleve.They usually occur when she is going to bed or sitting on the sofa.She occasionally has left-sided headache.  She was  seen by Dr. Yetta Glassman 11/30/2011. Her MRI report 11/29/2011 was not changed in the left hypertrophied temporal lobe according to the patient.she is to see him in two years. She says her memory is okay. She enjoys socializing with a group of friends at [employer] having her breakfast. 06/13/2012=( MMSE 25/30. Clock drawing task 4/4.Animal fluency test 9. Ron Parker  Index of independence in activities of daily 6. B[place]ba [place] instrumental activities of daily living scale 8. Neuropsychiatric inventory=  Apathy 4. Geriatric depression scale 1/15.Falls assessment tool score 15).   REVIEW OF SYSTEMS: Full 14 system review of systems performed and negative except: hearing loss shortness of breath diarrhea restless leg insomnia memory loss seizure tremors fatigue activity change.    ALLERGIES: No Known Allergies  HOME MEDICATIONS:  Outpatient Medications Prior to Visit  Medication Sig Dispense Refill  . amLODipine (NORVASC) 2.5 MG tablet Take 2.5 mg by mouth daily.  4  . Calcium Carbonate-Vitamin D (CALCIUM 600 + D PO) Take 1 tablet by mouth daily.     . DiphenhydrAMINE HCl, Sleep, (ZZZQUIL PO) Take 1 tablet by mouth at bedtime as needed.    . lamoTRIgine (LAMICTAL) 100 MG tablet ONE FULL TAB IN THE A.M., ONE HALF TAB AT NIGHT (150MG  TOTAL DAILY DOSE) 135 tablet 3  . metoprolol  succinate (TOPROL-XL) 50 MG 24 hr tablet Take 50 mg by mouth daily.  4  . Naproxen Sodium (ALEVE PO) Take by mouth as needed.    Marland Kitchen omeprazole (PRILOSEC) 20 MG capsule Take 20 mg by mouth daily.  4  . rOPINIRole (REQUIP) 1 MG tablet Take 1 tablet (1 mg total) by mouth 2 (two) times daily. 180 tablet 4  . venlafaxine (EFFEXOR) 37.5 MG tablet Take 37.5 mg by mouth daily.  4  . mupirocin ointment (BACTROBAN) 2 % Place 1 application into the nose 2 (two) times daily. 22 g 0  . diazepam (VALIUM) 2 MG tablet Take 2 mg by mouth every 6 (six) hours as needed for anxiety.    Marland Kitchen LORazepam (ATIVAN) 1 MG tablet Take 1 mg by mouth every 8 (eight) hours as needed.      No facility-administered medications prior to visit.     PAST MEDICAL HISTORY: Past Medical History:  Diagnosis Date  . Anxiety   . Arthritis    L arm, hips & knees - recently had injection in R  knee - Dr. Lorene Dy   . Brain tumor (benign) ([place]) 2010   has consulted /w Dr. Tommi Rumps at [facility] [place] 2013-  had MRI  . Cataract   . Dysrhythmia    "my heart skips some beats"  . GERD (gastroesophageal reflux disease)   . H/O hiatal hernia   . Headache(784.0)    pt. believes the recent headaches are related to "brain tumor", states she has a followup appt. wuith Neurologist in Sept.    . Heart murmur   . High cholesterol   . Hypertension   . Memory loss   . Parkinsonism ([place])   . Seizures ([place])    prior to diagnosed /w brain tumor, on lamictal since    . Shortness of breath    /w exertion     PAST SURGICAL HISTORY: Past Surgical History:  Procedure Laterality Date  . ABDOMINAL HYSTERECTOMY  1990s  . BRAIN BIOPSY  2010   L temple  . BREAST BIOPSY Right 12/12/2012   Procedure: Right breast wire guided excision;  Surgeon: Rolm Bookbinder, MD;  Location: [facility];  Service: General;  Laterality: Right;  . BUNIONECTOMY     left foot  . EYE SURGERY    . KNEE SURGERY Left 2006    FAMILY HISTORY: Family History  Problem  Relation Age of Onset  . Heart disease Mother   . Heart attack Mother   . Cancer Daughter     breast mets to lung  . Diabetes Son   . Colon cancer Father     SOCIAL HISTORY:  Social History   Social History  . Marital status: Widowed    Spouse name: N/A  . Number of children: 2  . Years of education: College   Occupational History  . Retired Retired   Social History Main Topics  . Smoking status: Never Smoker  . Smokeless tobacco: Never Used  . Alcohol use No  . Drug use: No  . Sexual activity: Not on file   Other Topics Concern  . Not on file   Social History Narrative   Patient lives at home alone with son part time   Caffeine Use: 1/2 cup a day     PHYSICAL EXAM  Vitals:   12/26/15 1330  BP: 123/65  Pulse: 63  Weight: 162 lb (73.5 kg)  Height: 5\' 2"  (1.575 m)    Not recorded     MMSE - Mini Mental State Exam 12/26/2015 06/06/2015 11/29/2014  Orientation to time 3 5 5   Orientation to Place 5 5 5   Registration 3 3 3   Attention/ Calculation 3 5 5   Recall 2 2 0  Language- name 2 objects 2 2 2   Language- repeat 0 1 1  Language- follow 3 step command 3 3 3   Language- read & follow direction 1 1 1   Write a sentence 1 1 1   Copy design 1 1 1   Total score 24 29 27      Body mass index is 29.63 kg/m.  GENERAL EXAM: Patient is in no distress   CARDIOVASCULAR: Regular rate and rhythm, no murmurs, no carotid bruits  NEUROLOGIC: MENTAL STATUS: awake, alert, language fluent, comprehension intact, naming intact; POSITIVE MYERSONS, BORDERLINE SNOUT, POSITIVE PALMOMENTAL.  CRANIAL NERVE: pupils equal and reactive to light, visual fields full to confrontation, extraocular muscles intact, no nystagmus, facial sensation and strength symmetric, uvula midline, shoulder shrug symmetric, tongue midline. MILD VOICE TREMOR. MOTOR: normal bulk; MILD COGWHEELING IN LUE WITH CONTRALATERAL REINFORCEMENT, MILD APRAXIA IN BUE, MODERATE BRADYKINESIA IN LUE > RUE; LLE > RLE.   Full strength in the BUE, BLE. SENSORY: normal and symmetric to light touch  COORDINATION: finger-nose-finger, fine finger movements SLOW. REFLEXES: deep tendon reflexes TRACE AND SYMM. GAIT/STATION: narrow based gait; SLOW TO STAND. UNSTEADY GAIT. DECR ARM SWING. STOOPED POSTURE.     DIAGNOSTIC DATA (LABS, IMAGING, TESTING) - I reviewed patient records, labs, notes, testing and imaging myself where available.  Lab Results  Component Value Date   WBC 6.8 12/05/2012   HGB 15.5 (H) 12/05/2012   HCT 45.4 12/05/2012   MCV 96.8 12/05/2012   PLT 202 12/05/2012      Component Value Date/Time   NA 138 12/05/2012 1301   K 4.2 12/05/2012 1301   CL 103 12/05/2012 1301   CO2 28 12/05/2012 1301   GLUCOSE 75 12/05/2012 1301   BUN 11 12/05/2012 1301   CREATININE 0.66 12/05/2012 1301   CALCIUM 9.8 12/05/2012 1301   GFRNONAA 79 (L) 12/05/2012 1301   GFRAA >90 12/05/2012 1301   No results found for: CHOL No results found for: HGBA1C No results found for: VITAMINB12 No results found for: TSH  06/29/12 MRI brain (with and without contrast) demonstrating: 1. Slight asymmetry of the mesial temporal lobes, with the left side slightly larger than the right. 2. Mild perisylvian and right mesial temporal atrophy.  3. Mild periventricular and subcortical non-specific foci of gliosis, likely chronic small vessel ischemic disease. 4. Mucosal thickening and bubbly secretions in the ethmoid, frontal and maxillary sinuses. 5. Compared to prior MRI from 03/04/09 there is no significant interval change.   ASSESSMENT AND PLAN  82 y.o. year old female here with history of complex partial seizures, abnormal MRI brain (s/p left temporal lobe biopsy --> astrocytic hypertrophy and hypertensive vascular changes; not clearly neoplastic). Also with RLS on ropinirole. Also with parkinsonian features and memory loss (progressing slowly).    Dx:   Parkinsonism, unspecified Parkinsonism type ([place])  Restless leg  syndrome  Partial symptomatic epilepsy with complex partial seizures, not intractable, without status epilepticus ([place])     PLAN: - continue lamotrigine (100mg  daily) for seizure prevention; patient has not been taking 50mg  at bedtime for many years apparently - increase ropinirole to 1mg  twice a day for RLS + parkinsonian features - use rollator walker - increase safety and supervision; patient needs supervision for medication mgmt - caution with living alone - no driving  Meds ordered this encounter  Medications  . DISCONTD: lamoTRIgine (LAMICTAL) 100 MG tablet    Sig: ONE FULL TAB IN THE A.M., ONE HALF TAB AT NIGHT (150MG  TOTAL DAILY DOSE)    Dispense:  150 tablet    Refill:  12  . rOPINIRole (REQUIP) 1 MG tablet    Sig: Take 1 tablet (1 mg total) by mouth 2 (two) times daily.    Dispense:  180 tablet    Refill:  4  . lamoTRIgine (LAMICTAL) 100 MG tablet    Sig: Take 1 tablet (100 mg total) by mouth daily.    Dispense:  90 tablet    Refill:  4   Return in about 1 year (around 12/25/2016).     Penni Bombard, MD [id number], [id number] PM Certified in Neurology, Neurophysiology and Neuroimaging  [facility] [address], [place], [place] [address] [phone]

## 2016-01-09 ENCOUNTER — Telehealth: Payer: Self-pay | Admitting: Diagnostic Neuroimaging

## 2016-01-09 DIAGNOSIS — G20 Parkinson's disease: Secondary | ICD-10-CM

## 2016-01-09 NOTE — Telephone Encounter (Signed)
I ordered rollator. -VRP

## 2016-01-09 NOTE — Telephone Encounter (Signed)
Pt's daughter called requesting RX for rollator. Please fax to [facility]

## 2016-01-10 NOTE — Telephone Encounter (Signed)
Dme was fax to [phone] twice.

## 2016-01-10 NOTE — Telephone Encounter (Signed)
Rollator walker DME order fax to [facility] at [phone].

## 2016-01-10 NOTE — Telephone Encounter (Signed)
Pt's daughter called in after speaking with [facility]. They have told her they have not received order. Can another one be faxed? Thank you

## 2016-01-10 NOTE — Telephone Encounter (Signed)
Rn spoke with Tabatha at [facility]. They receive the fax twice about the DME rolling walker. It was fax twice. Please tell daughter thanks. [facility] that was call [phone].

## 2016-01-13 ENCOUNTER — Telehealth: Payer: Self-pay | Admitting: *Deleted

## 2016-01-13 NOTE — Telephone Encounter (Signed)
Spoke with daughter re: order for walker. She stated her mother has already received the walker. She then stated her mother is taking Requip 1 mg tab, twice a day but continues to c/o jerking spells in the midday between doses. She stated she is concerned that her mother is taking an extra requip when this occurs. She is inquiring if the dose can be increased or if there is another medication that can be ordered. Informed her this RN will discuss with Dr Leta Baptist and let her know. She verbalized understanding, appreciation.

## 2016-01-13 NOTE — Telephone Encounter (Signed)
Per Dr Leta Baptist, spoke with daughter, Santiago Glad and informed her that her mother may take Requip 1 mg tabs three times a day; advised she try to take every 8 hours as much as is feasible. Advised she needs to give medication time to see of it's effectiveness. Advised Santiago Glad a new prescription will be called to [facility], [place]. Santiago Glad verbalized understanding, appreciation.  Spoke with Nevin Bloodgood, pharmacist, [facility] and advised her of change in frequency of medication, gave her new prescription. Nevin Bloodgood verbalized understanding.

## 2016-07-13 ENCOUNTER — Other Ambulatory Visit: Payer: Self-pay | Admitting: Internal Medicine

## 2016-07-13 DIAGNOSIS — Z1231 Encounter for screening mammogram for malignant neoplasm of breast: Secondary | ICD-10-CM

## 2016-07-13 DIAGNOSIS — E2839 Other primary ovarian failure: Secondary | ICD-10-CM

## 2016-08-06 ENCOUNTER — Ambulatory Visit
Admission: RE | Admit: 2016-08-06 | Discharge: 2016-08-06 | Disposition: A | Discharge status: Home / Self Care | Payer: Medicare Other | Source: Ambulatory Visit | Attending: Internal Medicine | Admitting: Internal Medicine

## 2016-08-06 DIAGNOSIS — E2839 Other primary ovarian failure: Secondary | ICD-10-CM

## 2016-08-06 DIAGNOSIS — Z1231 Encounter for screening mammogram for malignant neoplasm of breast: Secondary | ICD-10-CM

## 2016-12-29 ENCOUNTER — Encounter: Payer: Self-pay | Admitting: Diagnostic Neuroimaging

## 2016-12-29 ENCOUNTER — Ambulatory Visit (INDEPENDENT_AMBULATORY_CARE_PROVIDER_SITE_OTHER): Payer: Medicare Other | Admitting: Diagnostic Neuroimaging

## 2016-12-29 VITALS — BP 156/76 | HR 65 | Ht 62.0 in | Wt 178.2 lb

## 2016-12-29 DIAGNOSIS — G20 Parkinson's disease: Secondary | ICD-10-CM | POA: Diagnosis not present

## 2016-12-29 DIAGNOSIS — G2581 Restless legs syndrome: Secondary | ICD-10-CM

## 2016-12-29 MED ORDER — ROPINIROLE HCL 1 MG PO TABS: 1.0000 mg | ORAL_TABLET | Freq: Two times a day (BID) | ORAL | 4 refills | Status: DC

## 2016-12-29 MED ORDER — CARBIDOPA-LEVODOPA 25-100 MG PO TABS: 1.0000 | ORAL_TABLET | Freq: Three times a day (TID) | ORAL | 6 refills | Status: DC

## 2016-12-29 MED ORDER — LAMOTRIGINE 100 MG PO TABS
100.0000 mg | ORAL_TABLET | Freq: Every day | ORAL | 4 refills | Status: DC
Start: 2016-12-29 — End: 2017-07-12

## 2016-12-29 NOTE — Patient Instructions (Signed)
-   start carbidopa/levodopa half tab three times a day with meals x 1-2 weeks; then increase to 1 tab three times a day   - use rollator walker

## 2016-12-29 NOTE — Progress Notes (Signed)
[facility]  PATIENT: Carrie Short DOB: Feb 18, 1928  REFERRING CLINICIAN:  HISTORY FROM: patient and daughter  REASON FOR VISIT: follow up   HISTORICAL  CHIEF COMPLAINT:  Chief Complaint  Patient presents with  . Follow-up  . sz/ RLS    no sz, RLS about same, takes ativan at night (helps with sleep). States has L eye vision decrease last 2 months.  20/40 both eyes    HISTORY OF PRESENT ILLNESS:   UPDATE (12/29/16, [facility]P): Since last visit, doing well. Tolerating meds. No alleviating or aggravating factors. Some left eye blurred vision. Some more balance issues. No more driving.   UPDATE 12/26/15: Since last visit, continues to fall. Not using a walker. Still mowing yard.   UPDATE 06/06/15: Since last visit, doing well. No sz. Continues on ropinirole. Memory loss issues stable. Still feels unsteady with walking.   UPDATE 11/28/14: Doing well. On nightly ropinirole 1mg  qhs. 5-10 days per month, takes an extra tab as needed for breakthrough tremors. Overall stable. Has inverted sleep schedule. No seizures. Still has occ trips and falls.   UPDATE 08/28/14: Since last visit, has transitioned away from driving. Now son stays with her at night. No sz. Doing well on ropinirole. Having more nausea, and PCP advised to try stopping ropinirole. No benefit with nausea. Now pt back on ropinirole.  UPDATE 05/29/14: since last visit, has been using daily ropinirole 1mg  5x per month, and continues every nightly 1mg  dose. Some benefit with tremor. Also tried PT, with some benefit. Not using walker. Daughter concerned about memory loss and pt ability to drive. Daughter reports pt gets lost when driving, suddenly pulls out in front of other cars, and is not safe. Patient also concerned about left supra-orbital swelling and headache.  UPDATE 12/27/13: Since last visit, more tremor, more gait diff. No seizures. Memory loss slightly worse. Still lives at home. Son helps her with yard work. She  fell a few months ago, and had right wrist fracture.   UPDATE 06/28/13: Since last visit, more knee/hip/back pain. Restless legs slightly worse. Now with tremor (with handwriting). More balance difficulty. Also with some shortness of breath and emphysematous changes on lung testing.  UPDATE 12/26/12: Since last visit, stable. Some intermittent HA, left sided. Some intermittent left facial numbness. Some memory loss. Got lost driving to this appt this AM, and had to call her daughter for directions.  Still lives alone (with dog). Daughter and son call daily to check on her.  PRIOR HPI (06/13/12, Dr. Erling Cruz):  83 year old right-handed white widowed female with a 3-1/2 year history of hesitancy in speech, using neologisms and making inappropriate comments, at times associated with a blank stare. I saw her 12/08/2008 with normal neurologic examination, MMSE 25/30, CDT 4/4, AFT 10.  I started her on Aricept,venlafaxine was increased, and aspirin was continued. Evaluation included sedimentation rate, RPR, TSH, B12, and  CPK which were normal. MRI study of the brain 06/07/2008 was felt to be normal. CT scans of the brain 05/06/2007 and  10/12/2008 showed minimal small vessel disease and atrophy. EEG 12/13/2008 showed left-sided intermittent  slowing in the temporal  region. Repeat MRI study 03/04/2009 showed an asymmetry in the left temporal lobe versus the right, being larger, and having T1 signal alterations on the medial aspect and brighter signal in the left mesial temporal lobe on flair. 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 In review of the  MRI 06/07/2008. Dr. Derrill Memo at [facility] performed a stereotactic left temporal  biopsy 06/13/2009 with no pathologic diagnosis of  tumor present.  There was astrocytic hypertrophy and there were "hypertensive vascular changes present".  She did not feel "generally well" without documented seizures. She complained of  lack of energy.She was taken off of levetiracetam and  started on lamotrigine, progressing to 100 mg, one half twice daily. She states that she is confused at times with difficulty thinking of words. She denies auras of seizures. She occasionally stumbles. She does not notice language problems. Functionally she can cook, shop, clean, do laundry, make the bed and dishes, and mow the yard. She drives. She lives alone She has lost interest in traveling.  She has spells that come over her with ringing of her hands and moving her feet and cannot  be still. These occur while sitting or at nighttime without loss of consciousness  and last 15 minutes. She treats for restless leg symptoms with Aleve.They usually occur when she is going to bed or sitting on the sofa.She occasionally has left-sided headache.  She was  seen by Dr. Yetta Glassman 11/30/2011. Her MRI report 11/29/2011 was not changed in the left hypertrophied temporal lobe according to the patient.she is to see him in two years. She says her memory is okay. She enjoys socializing with a group of friends at [employer] having her breakfast. 06/13/2012=( MMSE 25/30. Clock drawing task 4/4.Animal fluency test 9. Ron Parker  Index of independence in activities of daily 6. Bubba Camp instrumental activities of daily living scale 8. Neuropsychiatric inventory=  Apathy 4. Geriatric depression scale 1/15.Falls assessment tool score 15).   REVIEW OF SYSTEMS: Full 14 system review of systems performed and negative except: loss of vision tremors.   ALLERGIES: No Known Allergies  HOME MEDICATIONS:  Outpatient Medications Prior to Visit  Medication Sig Dispense Refill  . amLODipine (NORVASC) 2.5 MG tablet Take 2.5 mg by mouth daily.  4  . Calcium Carbonate-Vitamin D (CALCIUM 600 + D PO) Take 1 tablet by mouth daily.     . DiphenhydrAMINE HCl, Sleep, (ZZZQUIL PO) Take 1 tablet by mouth at bedtime as needed.    . lamoTRIgine (LAMICTAL) 100 MG tablet Take 1 tablet (100 mg  total) by mouth daily. 90 tablet 4  . LORazepam (ATIVAN) 1 MG tablet Take 1 mg by mouth every 8 (eight) hours as needed.     . metoprolol succinate (TOPROL-XL) 50 MG 24 hr tablet Take 50 mg by mouth daily.  4  . Naproxen Sodium (ALEVE PO) Take by mouth as needed.    Marland Kitchen omeprazole (PRILOSEC) 20 MG capsule Take 20 mg by mouth daily.  4  . rOPINIRole (REQUIP) 1 MG tablet Take 1 tablet (1 mg total) by mouth 2 (two) times daily. 180 tablet 4  . venlafaxine (EFFEXOR) 37.5 MG tablet Take 37.5 mg by mouth daily.  4  . diazepam (VALIUM) 2 MG tablet Take 2 mg by mouth every 6 (six) hours as needed for anxiety.     No facility-administered medications prior to visit.     PAST MEDICAL HISTORY: Past Medical History:  Diagnosis Date  . Anxiety   . Arthritis    L arm, hips & knees - recently had injection in R  knee - Dr. Lorene Dy   . Brain tumor (benign) ([place]) 2010   has consulted /w [facility]. Tommi [facility] at [facility], AUG 2013- had MRI  . Cataract   . Dysrhythmia    "  my heart skips some beats"  . GERD (gastroesophageal reflux disease)   . H/O hiatal hernia   . Headache(784.0)    pt. believes the recent headaches are related to "brain tumor", states she has a followup appt. wuith Neurologist in Sept.    . Heart murmur   . High cholesterol   . Hypertension   . Memory loss   . Parkinsonism ([place])   . Seizures ([place])    prior to diagnosed /w brain tumor, on lamictal since    . Shortness of breath    /w exertion     PAST SURGICAL HISTORY: Past Surgical History:  Procedure Laterality Date  . ABDOMINAL HYSTERECTOMY  1990s  . BRAIN BIOPSY  2010   L temple  . BREAST BIOPSY Right 12/12/2012   Procedure: Right breast wire guided excision;  Surgeon: Rolm Bookbinder, MD;  Location: [facility];  Service: General;  Laterality: Right;  . BREAST EXCISIONAL BIOPSY Right    No scars seen   . BUNIONECTOMY     left foot  . EYE SURGERY    . KNEE SURGERY Left 2006    FAMILY HISTORY: Family History  Problem  Relation Age of Onset  . Heart disease Mother   . Heart attack Mother   . Cancer Daughter        breast mets to lung  . Breast cancer Daughter   . Diabetes Son   . Colon cancer Father     SOCIAL HISTORY:  Social History   Social History  . Marital status: Widowed    Spouse name: N/A  . Number of children: 2  . Years of education: College   Occupational History  . Retired Retired   Social History Main Topics  . Smoking status: Never Smoker  . Smokeless tobacco: Never Used  . Alcohol use No  . Drug use: No  . Sexual activity: Not on file   Other Topics Concern  . Not on file   Social History Narrative   Patient lives at home alone with son part time   Caffeine Use: 1/2 cup a day     PHYSICAL EXAM  Vitals:   12/29/16 1543  BP: (!) 156/76  Pulse: 65  Weight: 178 lb 3.2 oz (80.8 kg)  Height: 5\' 2"  (1.575 m)    Not recorded     MMSE - Mini Mental State Exam 12/26/2015 06/06/2015 11/29/2014  Orientation to time 3 5 5   Orientation to Place 5 5 5   Registration 3 3 3   Attention/ Calculation 3 5 5   Recall 2 2 0  Language- name 2 objects 2 2 2   Language- repeat 0 1 1  Language- follow 3 step command 3 3 3   Language- read & follow direction 1 1 1   Write a sentence 1 1 1   Copy design 1 1 1   Total score 24 29 27      Body mass index is 32.59 kg/m.  GENERAL EXAM: Patient is in no distress   CARDIOVASCULAR: Regular rate and rhythm, no murmurs, no carotid bruits  NEUROLOGIC: MENTAL STATUS: awake, alert, language fluent, comprehension intact, naming intact; POSITIVE MYERSONS, BORDERLINE SNOUT, POSITIVE PALMOMENTAL.  CRANIAL NERVE: pupils equal and reactive to light, visual fields full to confrontation, extraocular muscles intact, no nystagmus, facial sensation and strength symmetric, uvula midline, shoulder shrug symmetric, tongue midline. MILD VOICE TREMOR. MOTOR: normal bulk; MILD COGWHEELING IN LUE WITH CONTRALATERAL REINFORCEMENT, MILD APRAXIA IN BUE, MODERATE  BRADYKINESIA IN LUE > RUE; LLE > RLE.  Full strength in the BUE, BLE. SENSORY: normal and symmetric to light touch COORDINATION: finger-nose-finger, fine finger movements SLOW. REFLEXES: deep tendon reflexes TRACE AND SYMM. GAIT/STATION: narrow based gait; SEVERE FREEZING; VERY UNSTEADY; SLOW TO STAND. UNSTEADY GAIT. DECR ARM SWING. STOOPED POSTURE.     DIAGNOSTIC DATA (LABS, IMAGING, TESTING) - I reviewed patient records, labs, notes, testing and imaging myself where available.  Lab Results  Component Value Date   WBC 6.8 12/05/2012   HGB 15.5 (H) 12/05/2012   HCT 45.4 12/05/2012   MCV 96.8 12/05/2012   PLT 202 12/05/2012      Component Value Date/Time   NA 138 12/05/2012 1301   K 4.2 12/05/2012 1301   CL 103 12/05/2012 1301   CO2 28 12/05/2012 1301   GLUCOSE 75 12/05/2012 1301   BUN 11 12/05/2012 1301   CREATININE 0.66 12/05/2012 1301   CALCIUM 9.8 12/05/2012 1301   GFRNONAA 79 (L) 12/05/2012 1301   GFRAA >90 12/05/2012 1301   No results found for: CHOL No results found for: HGBA1C No results found for: VITAMINB12 No results found for: TSH  06/29/12 MRI brain (with and without contrast) demonstrating: 1. Slight asymmetry of the mesial temporal lobes, with the left side slightly larger than the right. 2. Mild perisylvian and right mesial temporal atrophy.  3. Mild periventricular and subcortical non-specific foci of gliosis, likely chronic small vessel ischemic disease. 4. Mucosal thickening and bubbly secretions in the ethmoid, frontal and maxillary sinuses. 5. Compared to prior MRI from 03/04/09 there is no significant interval change.   ASSESSMENT AND PLAN  83 y.o. year old female here with history of complex partial seizures, abnormal MRI brain (s/p left temporal lobe biopsy --> astrocytic hypertrophy and hypertensive vascular changes; not clearly neoplastic). Also with RLS on ropinirole. Also with parkinsonian features and memory loss (progressing slowly).     Dx:   Parkinson disease ([place])  Restless leg syndrome     PLAN:  PARKINSON'S DISEASE + RLS - continue ropinirole to 1mg  twice a day for RLS + parkinsonian features - start carbidopa/levodopa half tab three times a day with meals x 1-2 weeks; then increase to 1 tab three times a day  - use rollator walker  MEMORY LOSS - increase safety and supervision; patient needs supervision for medication mgmt - caution with living alone - no driving  SEIZURE PREVENTION  - continue lamotrigine (100mg  daily) for seizure prevention; patient has not been taking 50mg  at bedtime for many years apparently  Meds ordered this encounter  Medications  . rOPINIRole (REQUIP) 1 MG tablet    Sig: Take 1 tablet (1 mg total) by mouth 2 (two) times daily.    Dispense:  180 tablet    Refill:  4  . lamoTRIgine (LAMICTAL) 100 MG tablet    Sig: Take 1 tablet (100 mg total) by mouth daily.    Dispense:  90 tablet    Refill:  4   Return in about 6 months (around 06/28/2017).     Penni Bombard, MD 0/98/1191, 4:78 PM Certified in Neurology, Neurophysiology and Neuroimaging  [facility] [address] [place], [place] [address] [phone]

## 2017-01-11 ENCOUNTER — Emergency Department (HOSPITAL_BASED_OUTPATIENT_CLINIC_OR_DEPARTMENT_OTHER)
Admission: EM | Admit: 2017-01-11 | Discharge: 2017-01-11 | Disposition: A | Discharge status: Home / Self Care | Payer: Medicare Other | Attending: Emergency Medicine | Admitting: Emergency Medicine

## 2017-01-11 ENCOUNTER — Emergency Department (HOSPITAL_BASED_OUTPATIENT_CLINIC_OR_DEPARTMENT_OTHER): Payer: Medicare Other

## 2017-01-11 ENCOUNTER — Encounter (HOSPITAL_BASED_OUTPATIENT_CLINIC_OR_DEPARTMENT_OTHER): Payer: Self-pay | Admitting: *Deleted

## 2017-01-11 DIAGNOSIS — S99912A Unspecified injury of left ankle, initial encounter: Secondary | ICD-10-CM | POA: Diagnosis present

## 2017-01-11 DIAGNOSIS — G20 Parkinson's disease: Secondary | ICD-10-CM | POA: Diagnosis not present

## 2017-01-11 DIAGNOSIS — Y939 Activity, unspecified: Secondary | ICD-10-CM | POA: Insufficient documentation

## 2017-01-11 DIAGNOSIS — I10 Essential (primary) hypertension: Secondary | ICD-10-CM | POA: Diagnosis not present

## 2017-01-11 DIAGNOSIS — Y999 Unspecified external cause status: Secondary | ICD-10-CM | POA: Diagnosis not present

## 2017-01-11 DIAGNOSIS — W19XXXA Unspecified fall, initial encounter: Secondary | ICD-10-CM | POA: Diagnosis not present

## 2017-01-11 DIAGNOSIS — R51 Headache: Secondary | ICD-10-CM | POA: Insufficient documentation

## 2017-01-11 DIAGNOSIS — Y929 Unspecified place or not applicable: Secondary | ICD-10-CM | POA: Diagnosis not present

## 2017-01-11 DIAGNOSIS — S93492A Sprain of other ligament of left ankle, initial encounter: Secondary | ICD-10-CM | POA: Diagnosis not present

## 2017-01-11 DIAGNOSIS — Z79899 Other long term (current) drug therapy: Secondary | ICD-10-CM | POA: Diagnosis not present

## 2017-01-11 DIAGNOSIS — M7989 Other specified soft tissue disorders: Secondary | ICD-10-CM | POA: Insufficient documentation

## 2017-01-11 MED ORDER — TRAMADOL HCL 50 MG PO TABS: 50.0000 mg | ORAL_TABLET | Freq: Four times a day (QID) | ORAL | 0 refills | Status: DC | PRN

## 2017-01-11 NOTE — ED Triage Notes (Signed)
Pain in her left leg x 3 days. She has had a hard time walking. She was seen at [facility] today and told she may have a blood clot.

## 2017-01-11 NOTE — ED Notes (Signed)
ED Provider at bedside. 

## 2017-01-11 NOTE — ED Provider Notes (Signed)
[facility] [facility] DEPT [facility] Provider Note   CSN: [id number] Arrival date & time: 01/11/17  1837     History   Chief Complaint Chief Complaint  Patient presents with  . Leg Pain    HPI Carrie Short is a 83 y.o. female.  Patient referred in by [facility] urgent care primary care for concern for blood clot to the left leg. Patient has a history of Parkinson's disease. There was a probable fall that occurred about 3 days ago. Patient's been complaining of left foot and ankle pain since that time. Having trouble walking. Reportedly had x-rays done at the [facility] clinic reviewed by radiology without any acute fractures. Primary care there was concerned about possible DVT periods.      Past Medical History:  Diagnosis Date  . Anxiety   . Arthritis    L arm, hips & knees - recently had injection in R  knee - Dr. Lorene Dy   . Brain tumor (benign) ([place]) 2010   has consulted /w Dr. Tommi [facility]umps at [facility], AUG 2013- had MRI  . Cataract   . Dysrhythmia    "my heart skips some beats"  . GERD (gastroesophageal reflux disease)   . H/O hiatal hernia   . Headache(784.0)    pt. believes the recent headaches are related to "brain tumor", states she has a followup appt. wuith Neurologist in Sept.    . Heart murmur   . High cholesterol   . Hypertension   . Memory loss   . Parkinsonism ([place])   . Seizures ([place])    prior to diagnosed /w brain tumor, on lamictal since    . Shortness of breath    /w exertion     Patient Active Problem List   Diagnosis Date Noted  . Restless leg syndrome 06/28/2013  . MRI of brain abnormal 12/26/2012  . Complex partial seizures ([place]) 12/26/2012  . Memory loss 12/26/2012    Past Surgical History:  Procedure Laterality Date  . ABDOMINAL HYSTERECTOMY  1990s  . BRAIN BIOPSY  2010   L temple  . BREAST BIOPSY Right 12/12/2012   Procedure: Right breast wire guided excision;  Surgeon: Rolm Bookbinder, MD;  Location: [facility];  Service: General;  Laterality: Right;  .  BREAST EXCISIONAL BIOPSY Right    No scars seen   . BUNIONECTOMY     left foot  . EYE SURGERY    . KNEE SURGERY Left 2006    OB History    No data available       Home Medications    Prior to Admission medications   Medication Sig Start Date End Date Taking? Authorizing Provider  amLODipine (NORVASC) 2.5 MG tablet Take 2.5 mg by mouth daily. 04/25/15   [provider]  Calcium Carbonate-Vitamin D (CALCIUM 600 + D PO) Take 1 tablet by mouth daily.     [provider]  carbidopa-levodopa (SINEMET IR) 25-100 MG tablet Take 1 tablet by mouth 3 (three) times daily before meals. 12/29/16   Penumalli, Earlean Polka, MD  diazepam (VALIUM) 2 MG tablet Take 2 mg by mouth every 6 (six) hours as needed for anxiety.    [provider]  DiphenhydrAMINE HCl, Sleep, (ZZZQUIL PO) Take 1 tablet by mouth at bedtime as needed.    [provider]  lamoTRIgine (LAMICTAL) 100 MG tablet Take 1 tablet (100 mg total) by mouth daily. 12/29/16   Penumalli, Earlean Polka, MD  LORazepam (ATIVAN) 1 MG tablet Take 1 mg by mouth every  8 (eight) hours as needed.     [provider]  metoprolol succinate (TOPROL-XL) 50 MG 24 hr tablet Take 50 mg by mouth daily. 04/25/15   [provider]  Naproxen Sodium (ALEVE PO) Take by mouth as needed.    [provider]  omeprazole (PRILOSEC) 20 MG capsule Take 20 mg by mouth daily. 03/03/15   [provider]  rOPINIRole (REQUIP) 1 MG tablet Take 1 tablet (1 mg total) by mouth 2 (two) times daily. 12/29/16   Penumalli, Earlean Polka, MD  venlafaxine (EFFEXOR) 37.5 MG tablet Take 37.5 mg by mouth daily. 05/21/14   [provider]    Family History Family History  Problem Relation Age of Onset  . Heart disease Mother   . Heart attack Mother   . Cancer Daughter        breast mets to lung  . Breast cancer Daughter   . Diabetes Son   . Colon cancer Father     Social History Social History  Substance Use Topics  .  Smoking status: Never Smoker  . Smokeless tobacco: Never Used  . Alcohol use No     Allergies   Patient has no known allergies.   Review of Systems Review of Systems  Constitutional: Negative for fever.  HENT: Negative for congestion.   Eyes: Negative for redness.  Respiratory: Negative for shortness of breath.   Cardiovascular: Positive for leg swelling. Negative for chest pain.  Gastrointestinal: Negative for abdominal pain.  Genitourinary: Negative for hematuria.  Musculoskeletal: Positive for joint swelling.  Skin: Negative for wound.  Neurological: Positive for headaches.  Hematological: Does not bruise/bleed easily.  Psychiatric/Behavioral: Negative for confusion.     Physical Exam Updated Vital Signs BP (!) 157/64 (BP Location: Right Arm)   Pulse (!) 55   Temp 98.6 F (37 C) (Oral)   Resp 16   Ht 1.575 m (5\' 2" )   Wt 80.7 kg (178 lb)   SpO2 92%   BMI 32.56 kg/m   Physical Exam  Constitutional: She is oriented to person, place, and time. She appears well-developed and well-nourished. No distress.  HENT:  Head: Normocephalic and atraumatic.  Mouth/Throat: Oropharynx is clear and moist.  Eyes: Pupils are equal, round, and reactive to light. Conjunctivae and EOM are normal.  Neck: Neck supple.  Cardiovascular: Normal rate.   Pulmonary/Chest: Effort normal and breath sounds normal.  Abdominal: Soft. Bowel sounds are normal. There is no tenderness.  Musculoskeletal: Normal range of motion. She exhibits edema and tenderness.  Patient with swelling to the lateral aspect of the left ankle. There is some tenderness to palpation there and tenderness with range of motion of the ankle at that location. No foot swelling. Good cap refill. There is a little bit of swelling to the proximal leg. No palpable cord.  Neurological: She is alert and oriented to person, place, and time. No cranial nerve deficit. She exhibits normal muscle tone. Coordination normal.  Skin: Skin is  warm. Capillary refill takes less than 2 seconds.  Nursing note and vitals reviewed.    ED Treatments / Results  Labs (all labs ordered are listed, but only abnormal results are displayed) Labs Reviewed - No data to display  EKG  EKG Interpretation None       Radiology US Venous Img Lower  Left (dvt Study)  Result Date: 01/11/2017 CLINICAL DATA:  Left leg swelling for several days. EXAM: LEFT LOWER EXTREMITY VENOUS DOPPLER ULTRASOUND TECHNIQUE: Gray-scale sonography with graded compression,  as well as color Doppler and duplex ultrasound were performed to evaluate the lower extremity deep venous systems from the level of the common femoral vein and including the common femoral, femoral, profunda femoral, popliteal and calf veins including the posterior tibial, peroneal and gastrocnemius veins when visible. The superficial great saphenous vein was also interrogated. Spectral Doppler was utilized to evaluate flow at rest and with distal augmentation maneuvers in the common femoral, femoral and popliteal veins. COMPARISON:  None. FINDINGS: Contralateral Common Femoral Vein: Respiratory phasicity is normal and symmetric with the symptomatic side. No evidence of thrombus. Normal compressibility. Common Femoral Vein: No evidence of thrombus. Normal compressibility, respiratory phasicity and response to augmentation. Saphenofemoral Junction: No evidence of thrombus. Normal compressibility and flow on color Doppler imaging. Profunda Femoral Vein: No evidence of thrombus. Normal compressibility and flow on color Doppler imaging. Femoral Vein: No evidence of thrombus. Normal compressibility, respiratory phasicity and response to augmentation. Popliteal Vein: No evidence of thrombus. Normal compressibility, respiratory phasicity and response to augmentation. Calf Veins: No evidence of thrombus. Normal compressibility and flow on color Doppler imaging. Superficial Great Saphenous Vein: No evidence of  thrombus. Normal compressibility and flow on color Doppler imaging. Venous Reflux:  None. Other Findings: Baker cyst stones knee joint effusion are incidentally noted. Subcutaneous edema in the lateral ankle. IMPRESSION: No evidence of DVT within the left lower extremity. Electronically Signed   By: Jeb Levering M.D.   On: 01/11/2017 23:28    Procedures Procedures (including critical care time)  Medications Ordered in ED Medications - No data to display   Initial Impression / Assessment and Plan / ED Course  I have reviewed the triage vital signs and the nursing notes.  Pertinent labs & imaging results that were available during my care of the patient were reviewed by me and considered in my medical decision making (see chart for details).   patient referred in from [facility] urgent care. Patient with probable fall complaining of pain to 2 left foot ankle. X-rays done there are not available in our system but apparently read by radiology no acute fractures. There was evidence of some old fracture. Primary care doctor there was concerned about possible blood clot and patient was referred in to rule out a DVT of the left leg.  Clinically seems as if there is an ankle sprain with swelling to the lateral aspect of the ankle. No real calf tenderness. Nose swelling to the foot. Feel DVT is unlikely but since this was her concern and ultrasound was available here it has been ordered. If negative will treat as an ankle sprain.  Patient only ambulates infrequently.  Patient has a history of Parkinson's.  Ultrasound without evidence of deep vein thrombosis.  Will treat patient with ASO wrap and follow-up with sports medicine.   Final Clinical Impressions(s) / ED Diagnoses   Final diagnoses:  Sprain of anterior talofibular ligament of left ankle, initial encounter  Left leg swelling    New Prescriptions New Prescriptions   No medications on file     Fredia Sorrow, MD 01/11/17  2333

## 2017-01-11 NOTE — Discharge Instructions (Signed)
Ultrasound study showed no evidence of a blood clot in the leg. Seems to be most consistent with a left ankle sprain. Use the ASO ankle wrap for support when walking. Make an appointment to follow-up with sports medicine. Take the tramadol as needed for pain. Return for any new or worse symptoms.

## 2017-01-11 NOTE — ED Notes (Signed)
Patient transported to Ultrasound 

## 2017-01-11 NOTE — ED Notes (Signed)
Pt and family verbalize understanding of dc instructions and deny any further needs at this time 

## 2017-01-21 ENCOUNTER — Ambulatory Visit (HOSPITAL_BASED_OUTPATIENT_CLINIC_OR_DEPARTMENT_OTHER)
Admission: RE | Admit: 2017-01-21 | Discharge: 2017-01-21 | Disposition: A | Discharge status: Home / Self Care | Payer: Medicare Other | Source: Ambulatory Visit | Attending: Family Medicine | Admitting: Family Medicine

## 2017-01-21 ENCOUNTER — Ambulatory Visit (INDEPENDENT_AMBULATORY_CARE_PROVIDER_SITE_OTHER): Payer: Medicare Other | Admitting: Family Medicine

## 2017-01-21 ENCOUNTER — Encounter: Payer: Self-pay | Admitting: Family Medicine

## 2017-01-21 VITALS — BP 136/70 | HR 68 | Ht 63.0 in | Wt 179.0 lb

## 2017-01-21 DIAGNOSIS — S99912A Unspecified injury of left ankle, initial encounter: Secondary | ICD-10-CM | POA: Diagnosis not present

## 2017-01-21 DIAGNOSIS — R937 Abnormal findings on diagnostic imaging of other parts of musculoskeletal system: Secondary | ICD-10-CM | POA: Insufficient documentation

## 2017-01-21 DIAGNOSIS — M25572 Pain in left ankle and joints of left foot: Secondary | ICD-10-CM | POA: Insufficient documentation

## 2017-01-21 NOTE — Patient Instructions (Signed)
You have a fibula fracture. Wear the boot at all times - ok to take this off only when someone else is around and ONLY to wash the area, ice it, or when sleeping. Icing 15 minutes at a time 3-4 times a day as needed for pain, swelling. Elevate above your heart as much as possible. Tylenol 500mg  1-2 tabs three times a day as needed for pain. Follow up with me in 2 weeks for reevaluation. Call me if you can't keep the boot on and we will put a cast on instead.

## 2017-01-24 DIAGNOSIS — S99912D Unspecified injury of left ankle, subsequent encounter: Secondary | ICD-10-CM | POA: Insufficient documentation

## 2017-01-24 NOTE — Assessment & Plan Note (Signed)
independently reviewed radiographs and noted transverse fracture of distal fibula above level of ankle joint.  Cam walker instead of ASO.  Ok to remove just to wash, ice this, and sleep.  Icing, elevation, tylenol.  F/u in 2 weeks for reevaluation.

## 2017-01-24 NOTE — Progress Notes (Signed)
PCP: Lorene Dy, MD  Subjective:   HPI: Patient is a 83 y.o. female here for left ankle injury.  Patient here with family member who provided most of history. Patient was at home alone though she recalls falling at least a couple times. No obvious injuries that she is aware of to her left ankle but found to have swelling, pain lateral ankle. Believed to be around 9/21 that this started. She has been elevating, icing, wearing ASO. She had outside radiographs though I'm unable to see these - told no evidence fracture. Pain radiates up to knee. Worse with walking. Doppler u/s negative for DVT. Pain level up to 8/10 and sharp lateral ankle. She is using a cane. + swelling and some bruising. No numbness.  Past Medical History:  Diagnosis Date  . Anxiety   . Arthritis    L arm, hips & knees - recently had injection in R  knee - Dr. Lorene Dy   . Brain tumor (benign) ([place]) 2010   has consulted /w Dr. Tommi Rumps at [facility], AUG 2013- had MRI  . Cataract   . Dysrhythmia    "my heart skips some beats"  . GERD (gastroesophageal reflux disease)   . H/O hiatal hernia   . Headache(784.0)    pt. believes the recent headaches are related to "brain tumor", states she has a followup appt. wuith Neurologist in Sept.    . Heart murmur   . High cholesterol   . Hypertension   . Memory loss   . Parkinsonism ([place])   . Seizures ([place])    prior to diagnosed /w brain tumor, on lamictal since    . Shortness of breath    /w exertion     Current Outpatient Prescriptions on File Prior to Visit  Medication Sig Dispense Refill  . amLODipine (NORVASC) 2.5 MG tablet Take 2.5 mg by mouth daily.  4  . Calcium Carbonate-Vitamin D (CALCIUM 600 + D PO) Take 1 tablet by mouth daily.     . carbidopa-levodopa (SINEMET IR) 25-100 MG tablet Take 1 tablet by mouth 3 (three) times daily before meals. 90 tablet 6  . diazepam (VALIUM) 2 MG tablet Take 2 mg by mouth every 6 (six) hours as needed for anxiety.     . DiphenhydrAMINE HCl, Sleep, (ZZZQUIL PO) Take 1 tablet by mouth at bedtime as needed.    . lamoTRIgine (LAMICTAL) 100 MG tablet Take 1 tablet (100 mg total) by mouth daily. 90 tablet 4  . LORazepam (ATIVAN) 1 MG tablet Take 1 mg by mouth every 8 (eight) hours as needed.     . metoprolol succinate (TOPROL-XL) 50 MG 24 hr tablet Take 50 mg by mouth daily.  4  . Naproxen Sodium (ALEVE PO) Take by mouth as needed.    Marland Kitchen omeprazole (PRILOSEC) 20 MG capsule Take 20 mg by mouth daily.  4  . rOPINIRole (REQUIP) 1 MG tablet Take 1 tablet (1 mg total) by mouth 2 (two) times daily. 180 tablet 4  . traMADol (ULTRAM) 50 MG tablet Take 1 tablet (50 mg total) by mouth every 6 (six) hours as needed. 15 tablet 0   No current facility-administered medications on file prior to visit.     Past Surgical History:  Procedure Laterality Date  . ABDOMINAL HYSTERECTOMY  1990s  . BRAIN BIOPSY  2010   L temple  . BREAST BIOPSY Right 12/12/2012   Procedure: Right breast wire guided excision;  Surgeon: Rolm Bookbinder, MD;  Location: [place];  Service: General;  Laterality: Right;  . BREAST EXCISIONAL BIOPSY Right    No scars seen   . BUNIONECTOMY     left foot  . EYE SURGERY    . KNEE SURGERY Left 2006    No Known Allergies  Social History   Social History  . Marital status: Widowed    Spouse name: N/A  . Number of children: 2  . Years of education: College   Occupational History  . Retired Retired   Social History Main Topics  . Smoking status: Never Smoker  . Smokeless tobacco: Never Used  . Alcohol use No  . Drug use: No  . Sexual activity: Not on file   Other Topics Concern  . Not on file   Social History Narrative   Patient lives at home alone with son part time   Caffeine Use: 1/2 cup a day     Family History  Problem Relation Age of Onset  . Heart disease Mother   . Heart attack Mother   . Cancer Daughter        breast mets to lung  . Breast cancer Daughter   . Diabetes Son    . Colon cancer Father     BP 136/70   Pulse 68   Ht 5\' 3"  (1.6 m)   Wt 179 lb (81.2 kg)   BMI 31.71 kg/m   Review of Systems: See HPI above.     Objective:  Physical Exam:  Gen: NAD, comfortable in exam room  Left ankle/foot: Mild lateral ankle swelling.  No bruising, other deformity. ROM moderately limited all directions. TTP distal fibula focally.  Less tenderness over peroneal tendons, ant ankle joint.  No medial tenderness, base 5th, navicular tenderness. Negative ant drawer and talar tilt.   Painful syndesmotic compression. Thompsons test negative. NV intact distally.  Right ankle/foot: FROM without pain.  Assessment & Plan:  1. Left ankle injury - independently reviewed radiographs and noted transverse fracture of distal fibula above level of ankle joint.  Cam walker instead of ASO.  Ok to remove just to wash, ice this, and sleep.  Icing, elevation, tylenol.  F/u in 2 weeks for reevaluation.

## 2017-02-03 ENCOUNTER — Encounter: Payer: Self-pay | Admitting: Family Medicine

## 2017-02-03 ENCOUNTER — Ambulatory Visit (INDEPENDENT_AMBULATORY_CARE_PROVIDER_SITE_OTHER): Payer: Medicare Other | Admitting: Family Medicine

## 2017-02-03 DIAGNOSIS — S99912D Unspecified injury of left ankle, subsequent encounter: Secondary | ICD-10-CM | POA: Diagnosis not present

## 2017-02-03 NOTE — Patient Instructions (Signed)
You have a fibula fracture. Wear the boot at all times - ok to take this off only when someone else is around and ONLY to wash the area, ice it, or when sleeping. Icing 15 minutes at a time 3-4 times a day as needed for pain, swelling. Elevate above your heart as much as possible. Tylenol 500mg  1-2 tabs three times a day as needed for pain. Follow up with me in 2 weeks for reevaluation.

## 2017-02-05 NOTE — Progress Notes (Signed)
PCP: Lorene Dy, MD  Subjective:   HPI: Patient is a 83 y.o. female here for left ankle injury.  10/4: Patient here with family member who provided most of history. Patient was at home alone though she recalls falling at least a couple times. No obvious injuries that she is aware of to her left ankle but found to have swelling, pain lateral ankle. Believed to be around 9/21 that this started. She has been elevating, icing, wearing ASO. She had outside radiographs though I'm unable to see these - told no evidence fracture. Pain radiates up to knee. Worse with walking. Doppler u/s negative for DVT. Pain level up to 8/10 and sharp lateral ankle. She is using a cane. + swelling and some bruising. No numbness.  10/17: Patient reports she's doing well. Pain 0/10 currently, improved in the boot. Not taking any medicines for this. Sleeping with boot on also. No skin changes, swelling, numbness.  Past Medical History:  Diagnosis Date  . Anxiety   . Arthritis    L arm, hips & knees - recently had injection in R  knee - Dr. Lorene Dy   . Brain tumor (benign) ([place]) 2010   has consulted /w Dr. Tommi [facility]umps at [facility], AUG 2013- had MRI  . Cataract   . Dysrhythmia    "my heart skips some beats"  . GERD (gastroesophageal reflux disease)   . H/O hiatal hernia   . Headache(784.0)    pt. believes the recent headaches are related to "brain tumor", states she has a followup appt. wuith Neurologist in Sept.    . Heart murmur   . High cholesterol   . Hypertension   . Memory loss   . Parkinsonism ([place])   . Seizures ([place])    prior to diagnosed /w brain tumor, on lamictal since    . Shortness of breath    /w exertion     Current Outpatient Prescriptions on File Prior to Visit  Medication Sig Dispense Refill  . amLODipine (NORVASC) 2.5 MG tablet Take 2.5 mg by mouth daily.  4  . Calcium Carbonate-Vitamin D (CALCIUM 600 + D PO) Take 1 tablet by mouth daily.     . carbidopa-levodopa  (SINEMET IR) 25-100 MG tablet Take 1 tablet by mouth 3 (three) times daily before meals. 90 tablet 6  . diazepam (VALIUM) 2 MG tablet Take 2 mg by mouth every 6 (six) hours as needed for anxiety.    . DiphenhydrAMINE HCl, Sleep, (ZZZQUIL PO) Take 1 tablet by mouth at bedtime as needed.    . lamoTRIgine (LAMICTAL) 100 MG tablet Take 1 tablet (100 mg total) by mouth daily. 90 tablet 4  . LORazepam (ATIVAN) 1 MG tablet Take 1 mg by mouth every 8 (eight) hours as needed.     . metoprolol succinate (TOPROL-XL) 50 MG 24 hr tablet Take 50 mg by mouth daily.  4  . Naproxen Sodium (ALEVE PO) Take by mouth as needed.    Marland Kitchen omeprazole (PRILOSEC) 20 MG capsule Take 20 mg by mouth daily.  4  . rOPINIRole (REQUIP) 1 MG tablet Take 1 tablet (1 mg total) by mouth 2 (two) times daily. 180 tablet 4  . traMADol (ULTRAM) 50 MG tablet Take 1 tablet (50 mg total) by mouth every 6 (six) hours as needed. 15 tablet 0  . venlafaxine XR (EFFEXOR-XR) 75 MG 24 hr capsule Take 75 mg by mouth daily.  3   No current facility-administered medications on file prior to visit.  Past Surgical History:  Procedure Laterality Date  . ABDOMINAL HYSTERECTOMY  1990s  . BRAIN BIOPSY  2010   L temple  . BREAST BIOPSY Right 12/12/2012   Procedure: Right breast wire guided excision;  Surgeon: Rolm Bookbinder, MD;  Location: [place];  Service: General;  Laterality: Right;  . BREAST EXCISIONAL BIOPSY Right    No scars seen   . BUNIONECTOMY     left foot  . EYE SURGERY    . KNEE SURGERY Left 2006    No Known Allergies  Social History   Social History  . Marital status: Widowed    Spouse name: N/A  . Number of children: 2  . Years of education: College   Occupational History  . Retired Retired   Social History Main Topics  . Smoking status: Never Smoker  . Smokeless tobacco: Never Used  . Alcohol use No  . Drug use: No  . Sexual activity: Not on file   Other Topics Concern  . Not on file   Social History  Narrative   Patient lives at home alone with son part time   Caffeine Use: 1/2 cup a day     Family History  Problem Relation Age of Onset  . Heart disease Mother   . Heart attack Mother   . Cancer Daughter        breast mets to lung  . Breast cancer Daughter   . Diabetes Son   . Colon cancer Father     BP 128/71   Pulse 97   Ht 5\' 3"  (1.6 m)   Wt 179 lb (81.2 kg)   BMI 31.71 kg/m   Review of Systems: See HPI above.     Objective:  Physical Exam:  Gen: NAD, comfortable in exam room.  Left ankle/foot: No gross deformity, swelling, ecchymoses Mild limitation ROM all directions.  5/5 strength. TTP minimally distal fibula.  No other tenderness. Negative ant drawer and talar tilt.   Negative syndesmotic compression. Thompsons test negative. NV intact distally.  Assessment & Plan:  1. Left ankle injury - clinically improving from healing transverse distal fibula fracture.  Continue with cam walker for another 2 weeks.  Icing, elevation, tylenol if needed.  F/u in 2 weeks.

## 2017-02-05 NOTE — Assessment & Plan Note (Signed)
clinically improving from healing transverse distal fibula fracture.  Continue with cam walker for another 2 weeks.  Icing, elevation, tylenol if needed.  F/u in 2 weeks.

## 2017-02-17 ENCOUNTER — Ambulatory Visit: Payer: Medicare Other | Admitting: Family Medicine

## 2017-02-18 ENCOUNTER — Encounter: Payer: Self-pay | Admitting: Family Medicine

## 2017-02-18 ENCOUNTER — Ambulatory Visit (INDEPENDENT_AMBULATORY_CARE_PROVIDER_SITE_OTHER): Payer: Medicare Other | Admitting: Family Medicine

## 2017-02-18 DIAGNOSIS — S99912D Unspecified injury of left ankle, subsequent encounter: Secondary | ICD-10-CM

## 2017-02-20 NOTE — Assessment & Plan Note (Signed)
Clinically healed at this point from distal fibula fracture but with some stiffness from immobilization in the boot.  She will come out of the boot into a supportive shoe.  Expect some soreness next few days.  Icing, elevation, tylenol if needed.  Call us with any concerns otherwise follow up as needed.

## 2017-02-20 NOTE — Progress Notes (Signed)
PCP: Lorene Dy, MD  Subjective:   HPI: Patient is a 83 y.o. female here for left ankle injury.  10/4: Patient here with family member who provided most of history. Patient was at home alone though she recalls falling at least a couple times. No obvious injuries that she is aware of to her left ankle but found to have swelling, pain lateral ankle. Believed to be around 9/21 that this started. She has been elevating, icing, wearing ASO. She had outside radiographs though I'm unable to see these - told no evidence fracture. Pain radiates up to knee. Worse with walking. Doppler u/s negative for DVT. Pain level up to 8/10 and sharp lateral ankle. She is using a cane. + swelling and some bruising. No numbness.  10/17: Patient reports she's doing well. Pain 0/10 currently, improved in the boot. Not taking any medicines for this. Sleeping with boot on also. No skin changes, swelling, numbness.  11/1: Patient reports she's doing very well. Pain level 0/10. Only taking boot off when showering. No skin changes, numbness.  Past Medical History:  Diagnosis Date  . Anxiety   . Arthritis    L arm, hips & knees - recently had injection in R  knee - Dr. Lorene Dy   . Brain tumor (benign) ([place]) 2010   has consulted /w Dr. Tommi Rumps at [facility], AUG 2013- had MRI  . Cataract   . Dysrhythmia    "my heart skips some beats"  . GERD (gastroesophageal reflux disease)   . H/O hiatal hernia   . Headache(784.0)    pt. believes the recent headaches are related to "brain tumor", states she has a followup appt. wuith Neurologist in Sept.    . Heart murmur   . High cholesterol   . Hypertension   . Memory loss   . Parkinsonism ([place])   . Seizures ([place])    prior to diagnosed /w brain tumor, on lamictal since    . Shortness of breath    /w exertion     Current Outpatient Prescriptions on File Prior to Visit  Medication Sig Dispense Refill  . amLODipine (NORVASC) 2.5 MG tablet Take  2.5 mg by mouth daily.  4  . Calcium Carbonate-Vitamin D (CALCIUM 600 + D PO) Take 1 tablet by mouth daily.     . carbidopa-levodopa (SINEMET IR) 25-100 MG tablet Take 1 tablet by mouth 3 (three) times daily before meals. 90 tablet 6  . diazepam (VALIUM) 2 MG tablet Take 2 mg by mouth every 6 (six) hours as needed for anxiety.    . DiphenhydrAMINE HCl, Sleep, (ZZZQUIL PO) Take 1 tablet by mouth at bedtime as needed.    . lamoTRIgine (LAMICTAL) 100 MG tablet Take 1 tablet (100 mg total) by mouth daily. 90 tablet 4  . LORazepam (ATIVAN) 1 MG tablet Take 1 mg by mouth every 8 (eight) hours as needed.     . metoprolol succinate (TOPROL-XL) 50 MG 24 hr tablet Take 50 mg by mouth daily.  4  . Naproxen Sodium (ALEVE PO) Take by mouth as needed.    Marland Kitchen omeprazole (PRILOSEC) 20 MG capsule Take 20 mg by mouth daily.  4  . rOPINIRole (REQUIP) 1 MG tablet Take 1 tablet (1 mg total) by mouth 2 (two) times daily. 180 tablet 4  . traMADol (ULTRAM) 50 MG tablet Take 1 tablet (50 mg total) by mouth every 6 (six) hours as needed. 15 tablet 0  . venlafaxine XR (EFFEXOR-XR) 75 MG 24 hr capsule  Take 75 mg by mouth daily.  3   No current facility-administered medications on file prior to visit.     Past Surgical History:  Procedure Laterality Date  . ABDOMINAL HYSTERECTOMY  1990s  . BRAIN BIOPSY  2010   L temple  . BREAST BIOPSY Right 12/12/2012   Procedure: Right breast wire guided excision;  Surgeon: Rolm Bookbinder, MD;  Location: [place];  Service: General;  Laterality: Right;  . BREAST EXCISIONAL BIOPSY Right    No scars seen   . BUNIONECTOMY     left foot  . EYE SURGERY    . KNEE SURGERY Left 2006    No Known Allergies  Social History   Social History  . Marital status: Widowed    Spouse name: N/A  . Number of children: 2  . Years of education: College   Occupational History  . Retired Retired   Social History Main Topics  . Smoking status: Never Smoker  . Smokeless tobacco: Never Used   . Alcohol use No  . Drug use: No  . Sexual activity: Not on file   Other Topics Concern  . Not on file   Social History Narrative   Patient lives at home alone with son part time   Caffeine Use: 1/2 cup a day     Family History  Problem Relation Age of Onset  . Heart disease Mother   . Heart attack Mother   . Cancer Daughter        breast mets to lung  . Breast cancer Daughter   . Diabetes Son   . Colon cancer Father     BP 131/81   Pulse 63   Ht 5\' 3"  (1.6 m)   Wt 179 lb (81.2 kg)   BMI 31.71 kg/m   Review of Systems: See HPI above.     Objective:  Physical Exam:  Gen: NAD, comfortable in exam room.  Left ankle/foot: No gross deformity, swelling, ecchymoses Mild limitation ROM all directions.  5/5 strength all directions. No tenderness including distal fibula. Negative ant drawer and talar tilt.   Negative syndesmotic compression. Thompsons test negative. NV intact distally.  Assessment & Plan:  1. Left ankle injury - Clinically healed at this point from distal fibula fracture but with some stiffness from immobilization in the boot.  She will come out of the boot into a supportive shoe.  Expect some soreness next few days.  Icing, elevation, tylenol if needed.  Call us with any concerns otherwise follow up as needed.

## 2017-04-29 ENCOUNTER — Encounter: Payer: Self-pay | Admitting: Podiatry

## 2017-04-29 ENCOUNTER — Ambulatory Visit: Payer: Medicare Other | Admitting: Podiatry

## 2017-04-29 ENCOUNTER — Ambulatory Visit (INDEPENDENT_AMBULATORY_CARE_PROVIDER_SITE_OTHER): Payer: Medicare Other

## 2017-04-29 ENCOUNTER — Ambulatory Visit: Payer: Self-pay

## 2017-04-29 ENCOUNTER — Other Ambulatory Visit: Payer: Self-pay | Admitting: Podiatry

## 2017-04-29 DIAGNOSIS — M21619 Bunion of unspecified foot: Secondary | ICD-10-CM | POA: Diagnosis not present

## 2017-04-29 DIAGNOSIS — L84 Corns and callosities: Secondary | ICD-10-CM | POA: Diagnosis not present

## 2017-04-29 DIAGNOSIS — M79672 Pain in left foot: Secondary | ICD-10-CM | POA: Diagnosis not present

## 2017-04-29 DIAGNOSIS — M79671 Pain in right foot: Secondary | ICD-10-CM | POA: Diagnosis not present

## 2017-04-29 NOTE — Progress Notes (Signed)
Subjective:   Patient ID: Carrie Short, female   DOB: 84 y.o.   MRN: [id number]   HPI Patient presents with caregiver stating she's had previous bunion surgery done on the left foot and she's got severe lesions underneath her right foot which make it hard to walk. She's tried to trim them herself without success and tries padded shoes patient does not smoke and likes to be active   Review of Systems  All other systems reviewed and are negative.       Objective:  Physical Exam  Constitutional: She appears well-developed and well-nourished.  Cardiovascular: Intact distal pulses.  Pulmonary/Chest: Effort normal.  Musculoskeletal: Normal range of motion.  Neurological: She is alert.  Skin: Skin is warm.  Nursing note and vitals reviewed.  Neurovascular status found to be mildly diminished but intact with patient noted to have varicosities in the ankle region bilateral. There is structural bunion deformity right over left with severe keratotic lesion sub-first and fifth metatarsal head right that are thick and painful when palpated. 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

## 2017-04-29 NOTE — Patient Instructions (Signed)

## 2017-06-29 ENCOUNTER — Ambulatory Visit: Payer: Medicare Other | Admitting: Diagnostic Neuroimaging

## 2017-07-12 ENCOUNTER — Encounter: Payer: Self-pay | Admitting: Diagnostic Neuroimaging

## 2017-07-12 ENCOUNTER — Ambulatory Visit: Payer: Medicare Other | Admitting: Diagnostic Neuroimaging

## 2017-07-12 VITALS — BP 133/74 | HR 62 | Ht 63.0 in | Wt 174.2 lb

## 2017-07-12 DIAGNOSIS — G2581 Restless legs syndrome: Secondary | ICD-10-CM

## 2017-07-12 DIAGNOSIS — G20 Parkinson's disease: Secondary | ICD-10-CM | POA: Diagnosis not present

## 2017-07-12 DIAGNOSIS — F0390 Unspecified dementia without behavioral disturbance: Secondary | ICD-10-CM

## 2017-07-12 DIAGNOSIS — F03A0 Unspecified dementia, mild, without behavioral disturbance, psychotic disturbance, mood disturbance, and anxiety: Secondary | ICD-10-CM

## 2017-07-12 MED ORDER — LAMOTRIGINE 100 MG PO TABS: 100.0000 mg | ORAL_TABLET | Freq: Every day | ORAL | 4 refills | Status: DC

## 2017-07-12 MED ORDER — ROPINIROLE HCL 1 MG PO TABS: 1.0000 mg | ORAL_TABLET | Freq: Two times a day (BID) | ORAL | 4 refills | Status: DC

## 2017-07-12 NOTE — Progress Notes (Signed)
[facility]  PATIENT: Carrie Short DOB: Dec 28, 1927  REFERRING CLINICIAN:  HISTORY FROM: patient and daughter  REASON FOR VISIT: follow up   HISTORICAL  CHIEF COMPLAINT:  Chief Complaint  Patient presents with  . Follow-up  . PD/ RLS/ Memory    pt states she is about same.  Has had 2 falls since last seen.  Taking sinemet as needed (not tid).  MMSE 19/30.  Daughter with pt.  (pt lives with son and girlfriend).     HISTORY OF PRESENT ILLNESS:   UPDATE (07/12/17, VRP): Since last visit, doing worse. More falls. More memory loss. Tolerating meds. Not taking carb/levo regularly. No alleviating or aggravating factors. Poor insight. Limited supervision at home.   UPDATE (12/29/16, VRP): Since last visit, doing well. Tolerating meds. No alleviating or aggravating factors. Some left eye blurred vision. Some more balance issues. No more driving.   UPDATE 12/26/15: Since last visit, continues to fall. Not using a walker. Still mowing yard.   UPDATE 06/06/15: Since last visit, doing well. No sz. Continues on ropinirole. Memory loss issues stable. Still feels unsteady with walking.   UPDATE 11/28/14: Doing well. On nightly ropinirole 1mg  qhs. 5-10 days per month, takes an extra tab as needed for breakthrough tremors. Overall stable. Has inverted sleep schedule. No seizures. Still has occ trips and falls.   UPDATE 08/28/14: Since last visit, has transitioned away from driving. Now son stays with her at night. No sz. Doing well on ropinirole. Having more nausea, and PCP advised to try stopping ropinirole. No benefit with nausea. Now pt back on ropinirole.  UPDATE 05/29/14: since last visit, has been using daily ropinirole 1mg  5x per month, and continues every nightly 1mg  dose. Some benefit with tremor. Also tried PT, with some benefit. Not using walker. Daughter concerned about memory loss and pt ability to drive. Daughter reports pt gets lost when driving, suddenly pulls out in  front of other cars, and is not safe. Patient also concerned about left supra-orbital swelling and headache.  UPDATE 12/27/13: Since last visit, more tremor, more gait diff. No seizures. Memory loss slightly worse. Still lives at home. Son helps her with yard work. She fell a few months ago, and had right wrist fracture.   UPDATE 06/28/13: Since last visit, more knee/hip/back pain. Restless legs slightly worse. Now with tremor (with handwriting). More balance difficulty. Also with some shortness of breath and emphysematous changes on lung testing.  UPDATE 12/26/12: Since last visit, stable. Some intermittent HA, left sided. Some intermittent left facial numbness. Some memory loss. Got lost driving to this appt this AM, and had to call her daughter for directions.  Still lives alone (with dog). Daughter and son call daily to check on her.  PRIOR HPI (06/13/12, Dr. Erling Cruz):  84 year old right-handed white widowed female with a 3-1/2 year history of hesitancy in speech, using neologisms and making inappropriate comments, at times associated with a blank stare. I saw her 12/08/2008 with normal neurologic examination, MMSE 25/30, CDT 4/4, AFT 10.  I started her on Aricept,venlafaxine was increased, and aspirin was continued. Evaluation included sedimentation rate, RPR, TSH, B12, and  CPK which were normal. MRI study of the brain 06/07/2008 was felt to be normal. CT scans of the brain 05/06/2007 and  10/12/2008 showed minimal small vessel disease and atrophy. EEG 12/13/2008 showed left-sided intermittent  slowing in the temporal  region. Repeat MRI study 03/04/2009 showed an asymmetry in the left temporal lobe versus the right,  being larger, and having T1 signal alterations on the medial aspect and brighter signal in the left mesial temporal lobe on flair. 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 In review of the MRI 06/07/2008. Dr. Derrill Memo at [facility]  performed a stereotactic left temporal  biopsy 06/13/2009 with no pathologic diagnosis of  tumor present.  There was astrocytic hypertrophy and there were "hypertensive vascular changes present".  She did not feel "generally well" without documented seizures. She complained of lack of energy.She was taken off of levetiracetam and  started on lamotrigine, progressing to 100 mg, one half twice daily. She states that she is confused at times with difficulty thinking of words. She denies auras of seizures. She occasionally stumbles. She does not notice language problems. Functionally she can cook, shop, clean, do laundry, make the bed and dishes, and mow the yard. She drives. She lives alone She has lost interest in traveling.  She has spells that come over her with ringing of her hands and moving her feet and cannot  be still. These occur while sitting or at nighttime without loss of consciousness  and last 15 minutes. She treats for restless leg symptoms with Aleve.They usually occur when she is going to bed or sitting on the sofa.She occasionally has left-sided headache.  She was  seen by Dr. Yetta Glassman 11/30/2011. Her MRI report 11/29/2011 was not changed in the left hypertrophied temporal lobe according to the patient.she is to see him in two years. She says her memory is okay. She enjoys socializing with a group of friends at [employer] having her breakfast. 06/13/2012=( MMSE 25/30. Clock drawing task 4/4.Animal fluency test 9. Ron Parker  Index of independence in activities of daily 6. Bubba Camp instrumental activities of daily living scale 8. Neuropsychiatric inventory=  Apathy 4. Geriatric depression scale 1/15.Falls assessment tool score 15).   REVIEW OF SYSTEMS: Full 14 system review of systems performed and negative except: loss of vision tremors.   ALLERGIES: No Known Allergies  HOME MEDICATIONS:  Outpatient Medications Prior to Visit  Medication Sig Dispense Refill  . amLODipine (NORVASC) 2.5 MG  tablet Take 2.5 mg by mouth daily.  4  . Calcium Carbonate-Vitamin D (CALCIUM 600 + D PO) Take 1 tablet by mouth daily.     . carbidopa-levodopa (SINEMET IR) 25-100 MG tablet Take 1 tablet by mouth 3 (three) times daily before meals. (Patient taking differently: Take 1 tablet by mouth 3 (three) times daily as needed. ) 90 tablet 6  . lamoTRIgine (LAMICTAL) 100 MG tablet Take 1 tablet (100 mg total) by mouth daily. 90 tablet 4  . LORazepam (ATIVAN) 1 MG tablet Take 1 mg by mouth every 8 (eight) hours as needed.     . metoprolol succinate (TOPROL-XL) 50 MG 24 hr tablet Take 50 mg by mouth daily.  4  . Naproxen Sodium (ALEVE PO) Take by mouth as needed.    Marland Kitchen omeprazole (PRILOSEC) 20 MG capsule Take 20 mg by mouth daily.  4  . rOPINIRole (REQUIP) 1 MG tablet Take 1 tablet (1 mg total) by mouth 2 (two) times daily. 180 tablet 4  . traMADol (ULTRAM) 50 MG tablet Take 1 tablet (50 mg total) by mouth every 6 (six) hours as needed. 15 tablet 0  . venlafaxine XR (EFFEXOR-XR) 75 MG 24 hr capsule Take 75 mg by mouth daily.  3  . diazepam (VALIUM) 2 MG tablet Take 2 mg by  mouth every 6 (six) hours as needed for anxiety.    . DiphenhydrAMINE HCl, Sleep, (ZZZQUIL PO) Take 1 tablet by mouth at bedtime as needed.     No facility-administered medications prior to visit.     PAST MEDICAL HISTORY: Past Medical History:  Diagnosis Date  . Anxiety   . Arthritis    L arm, hips & knees - recently had injection in R  knee - Dr. Lorene Dy   . Brain tumor (benign) ([place]) 2010   has consulted /w Dr. Tommi Rumps at [facility], AUG 2013- had MRI  . Cataract   . Dysrhythmia    "my heart skips some beats"  . GERD (gastroesophageal reflux disease)   . H/O hiatal hernia   . Headache(784.0)    pt. believes the recent headaches are related to "brain tumor", states she has a followup appt. wuith Neurologist in Sept.    . Heart murmur   . High cholesterol   . Hypertension   . Memory loss   . Parkinsonism ([place])   .  Seizures ([place])    prior to diagnosed /w brain tumor, on lamictal since    . Shortness of breath    /w exertion     PAST SURGICAL HISTORY: Past Surgical History:  Procedure Laterality Date  . ABDOMINAL HYSTERECTOMY  1990s  . BRAIN BIOPSY  2010   L temple  . BREAST BIOPSY Right 12/12/2012   Procedure: Right breast wire guided excision;  Surgeon: Rolm Bookbinder, MD;  Location: [facility];  Service: General;  Laterality: Right;  . BREAST EXCISIONAL BIOPSY Right    No scars seen   . BUNIONECTOMY     left foot  . EYE SURGERY    . KNEE SURGERY Left 2006    FAMILY HISTORY: Family History  Problem Relation Age of Onset  . Heart disease Mother   . Heart attack Mother   . Cancer Daughter        breast mets to lung  . Breast cancer Daughter   . Diabetes Son   . Colon cancer Father     SOCIAL HISTORY:  Social History   Socioeconomic History  . Marital status: Widowed    Spouse name: Not on file  . Number of children: 2  . Years of education: College  . Highest education level: Not on file  Occupational History  . Occupation: Retired    Fish farm manager: RETIRED  Social Needs  . Financial resource strain: Not on file  . Food insecurity:    Worry: Not on file    Inability: Not on file  . Transportation needs:    Medical: Not on file    Non-medical: Not on file  Tobacco Use  . Smoking status: Never Smoker  . Smokeless tobacco: Never Used  Substance and Sexual Activity  . Alcohol use: No  . Drug use: No  . Sexual activity: Not on file  Lifestyle  . Physical activity:    Days per week: Not on file    Minutes per session: Not on file  . Stress: Not on file  Relationships  . Social connections:    Talks on phone: Not on file    Gets together: Not on file    Attends religious service: Not on file    Active member of club or organization: Not on file    Attends meetings of clubs or organizations: Not on file    Relationship status: Not on file  . Intimate partner violence:  Fear of current or ex partner: Not on file    Emotionally abused: Not on file    Physically abused: Not on file    Forced sexual activity: Not on file  Other Topics Concern  . Not on file  Social History Narrative   Patient lives at home alone with son part time   Caffeine Use: 1/2 cup a day     PHYSICAL EXAM  Vitals:   07/12/17 1400  BP: 133/74  Pulse: 62  Weight: 174 lb 3.2 oz (79 kg)  Height: 5\' 3"  (1.6 m)    Not recorded     MMSE - Mini Mental State Exam 07/12/2017 12/26/2015 06/06/2015  Orientation to time 2 3 5   Orientation to Place 3 5 5   Registration 3 3 3   Attention/ Calculation 0 3 5  Recall 3 2 2   Language- name 2 objects 2 2 2   Language- repeat 1 0 1  Language- follow 3 step command 2 3 3   Language- read & follow direction 1 1 1   Write a sentence 1 1 1   Copy design 1 1 1   Total score 19 24 29      Body mass index is 30.86 kg/m.  GENERAL EXAM: Patient is in no distress   CARDIOVASCULAR: Regular rate and rhythm, no murmurs, no carotid bruits  NEUROLOGIC: MENTAL STATUS: awake, alert, language fluent, comprehension intact, naming intact; POSITIVE MYERSONS, BORDERLINE SNOUT, POSITIVE PALMOMENTAL.  CRANIAL NERVE: pupils equal and reactive to light, visual fields full to confrontation, extraocular muscles intact, no nystagmus, facial sensation and strength symmetric, uvula midline, shoulder shrug symmetric, tongue midline. MILD VOICE TREMOR. MOTOR: normal bulk; MILD COGWHEELING IN LUE WITH CONTRALATERAL REINFORCEMENT, MILD APRAXIA IN BUE, MODERATE BRADYKINESIA IN LUE > RUE; LLE > RLE.  Full strength in the BUE, BLE. SENSORY: normal and symmetric to light touch COORDINATION: finger-nose-finger, fine finger movements SLOW. REFLEXES: deep tendon reflexes TRACE AND SYMM. GAIT/STATION: narrow based gait; UNSTEADY; SLOW TO STAND. UNSTEADY GAIT. DECR ARM SWING. STOOPED POSTURE.     DIAGNOSTIC DATA (LABS, IMAGING, TESTING) - I reviewed patient records, labs,  notes, testing and imaging myself where available.  Lab Results  Component Value Date   WBC 6.8 12/05/2012   HGB 15.5 (H) 12/05/2012   HCT 45.4 12/05/2012   MCV 96.8 12/05/2012   PLT 202 12/05/2012      Component Value Date/Time   NA 138 12/05/2012 1301   K 4.2 12/05/2012 1301   CL 103 12/05/2012 1301   CO2 28 12/05/2012 1301   GLUCOSE 75 12/05/2012 1301   BUN 11 12/05/2012 1301   CREATININE 0.66 12/05/2012 1301   CALCIUM 9.8 12/05/2012 1301   GFRNONAA 79 (L) 12/05/2012 1301   GFRAA >90 12/05/2012 1301   No results found for: CHOL No results found for: HGBA1C No results found for: VITAMINB12 No results found for: TSH  06/29/12 MRI brain (with and without contrast) demonstrating: 1. Slight asymmetry of the mesial temporal lobes, with the left side slightly larger than the right. 2. Mild perisylvian and right mesial temporal atrophy.  3. Mild periventricular and subcortical non-specific foci of gliosis, likely chronic small vessel ischemic disease. 4. Mucosal thickening and bubbly secretions in the ethmoid, frontal and maxillary sinuses. 5. Compared to prior MRI from 03/04/09 there is no significant interval change.   ASSESSMENT AND PLAN  84 y.o. year old female here with history of complex partial seizures, abnormal MRI brain (s/p left temporal lobe biopsy --> astrocytic hypertrophy and hypertensive vascular changes;  not clearly neoplastic). Also with RLS on ropinirole. Also with parkinsonian features and memory loss (progressing slowly).    Dx:   No diagnosis found.     PLAN:  PARKINSON'S DISEASE - continue ropinirole 1mg  twice a day for RLS + parkinsonian features - stop carbidopa/levodopa (patient not taking carb/levo regularly; does not have adequate supervision for medications) - use rollator walker; increase supervision  MEMORY LOSS / DEMENTIA - increase safety and supervision; patient needs supervision for medication management - caution with living  alone - no driving  SEIZURE PREVENTION  - continue lamotrigine (100mg  daily) for seizure prevention  Meds ordered this encounter  Medications  . lamoTRIgine (LAMICTAL) 100 MG tablet    Sig: Take 1 tablet (100 mg total) by mouth daily.    Dispense:  90 tablet    Refill:  4  . rOPINIRole (REQUIP) 1 MG tablet    Sig: Take 1 tablet (1 mg total) by mouth 2 (two) times daily.    Dispense:  180 tablet    Refill:  4   Return in about 1 year (around 07/13/2018).     Penni Bombard, MD 05/17/7865, 6:72 PM Certified in Neurology, Neurophysiology and Neuroimaging  [facility] [address], [place], [place] [address] [phone]

## 2017-07-12 NOTE — Patient Instructions (Signed)
-   continue lamotrigine 100mg  daily  - continue ropinirole 1mg  twice a day   - stop carbidopa / levodopa  - use walker  - increase supervision at home  - no driving

## 2017-07-24 ENCOUNTER — Other Ambulatory Visit: Payer: Self-pay | Admitting: Diagnostic Neuroimaging

## 2017-10-28 ENCOUNTER — Emergency Department (HOSPITAL_COMMUNITY)
Admission: EM | Admit: 2017-10-28 | Discharge: 2017-10-29 | Disposition: A | Discharge status: Home / Self Care | Payer: Medicare Other | Attending: Emergency Medicine | Admitting: Emergency Medicine

## 2017-10-28 ENCOUNTER — Emergency Department (HOSPITAL_COMMUNITY): Payer: Medicare Other

## 2017-10-28 ENCOUNTER — Other Ambulatory Visit: Payer: Self-pay

## 2017-10-28 ENCOUNTER — Encounter (HOSPITAL_COMMUNITY): Payer: Self-pay | Admitting: Emergency Medicine

## 2017-10-28 DIAGNOSIS — S0993XA Unspecified injury of face, initial encounter: Secondary | ICD-10-CM | POA: Diagnosis present

## 2017-10-28 DIAGNOSIS — Z79899 Other long term (current) drug therapy: Secondary | ICD-10-CM | POA: Diagnosis not present

## 2017-10-28 DIAGNOSIS — I10 Essential (primary) hypertension: Secondary | ICD-10-CM | POA: Diagnosis not present

## 2017-10-28 DIAGNOSIS — Y939 Activity, unspecified: Secondary | ICD-10-CM | POA: Insufficient documentation

## 2017-10-28 DIAGNOSIS — Y998 Other external cause status: Secondary | ICD-10-CM | POA: Insufficient documentation

## 2017-10-28 DIAGNOSIS — R531 Weakness: Secondary | ICD-10-CM | POA: Insufficient documentation

## 2017-10-28 DIAGNOSIS — W19XXXA Unspecified fall, initial encounter: Secondary | ICD-10-CM | POA: Insufficient documentation

## 2017-10-28 DIAGNOSIS — Y929 Unspecified place or not applicable: Secondary | ICD-10-CM | POA: Diagnosis not present

## 2017-10-28 DIAGNOSIS — E7800 Pure hypercholesterolemia, unspecified: Secondary | ICD-10-CM | POA: Diagnosis not present

## 2017-10-28 LAB — COMPREHENSIVE METABOLIC PANEL
ALBUMIN: 3.2 g/dL — AB (ref 3.5–5.0)
ALK PHOS: 185 U/L — AB (ref 38–126)
ALT: 61 U/L — ABNORMAL HIGH (ref 0–44)
ANION GAP: 8 (ref 5–15)
AST: 86 U/L — ABNORMAL HIGH (ref 15–41)
BILIRUBIN TOTAL: 0.7 mg/dL (ref 0.3–1.2)
BUN: 13 mg/dL (ref 8–23)
CALCIUM: 9.1 mg/dL (ref 8.9–10.3)
CO2: 29 mmol/L (ref 22–32)
Chloride: 105 mmol/L (ref 98–111)
Creatinine, Ser: 0.76 mg/dL (ref 0.44–1.00)
GFR calc Af Amer: >60 mL/min (ref >60)
Glucose, Bld: 169 mg/dL — ABNORMAL HIGH (ref 70–99)
POTASSIUM: 4 mmol/L (ref 3.5–5.1)
Sodium: 142 mmol/L (ref 135–145)
TOTAL PROTEIN: 6.8 g/dL (ref 6.5–8.1)

## 2017-10-28 LAB — CBC WITH DIFFERENTIAL/PLATELET
Abs Immature Granulocytes: 0 10*3/uL (ref 0.0–0.1)
Basophils Absolute: 0.1 10*3/uL (ref 0.0–0.1)
Basophils Relative: 1 %
EOS ABS: 0.1 10*3/uL (ref 0.0–0.7)
Eosinophils Relative: 1 %
HEMATOCRIT: 45.7 % (ref 36.0–46.0)
HEMOGLOBIN: 14.7 g/dL (ref 12.0–15.0)
Immature Granulocytes: 0 %
LYMPHS ABS: 1.3 10*3/uL (ref 0.7–4.0)
LYMPHS PCT: 22 %
MCH: 31.1 pg (ref 26.0–34.0)
MCHC: 32.2 g/dL (ref 30.0–36.0)
MCV: 96.8 fL (ref 78.0–100.0)
MONO ABS: 0.6 10*3/uL (ref 0.1–1.0)
MONOS PCT: 10 %
Neutro Abs: 3.9 10*3/uL (ref 1.7–7.7)
Neutrophils Relative %: 66 %
Platelets: 110 10*3/uL — ABNORMAL LOW (ref 150–400)
RBC: 4.72 MIL/uL (ref 3.87–5.11)
RDW: 13.3 % (ref 11.5–15.5)
WBC: 5.9 10*3/uL (ref 4.0–10.5)

## 2017-10-28 LAB — I-STAT TROPONIN, ED: Troponin i, poc: 0.02 ng/mL (ref 0.00–0.08)

## 2017-10-28 MED ORDER — SODIUM CHLORIDE 0.9 % IV BOLUS
1000.0000 mL | Freq: Once | INTRAVENOUS | Status: AC
  Administered 2017-10-28: 1000 mL via INTRAVENOUS

## 2017-10-28 NOTE — ED Notes (Signed)
Patient transported to CT 

## 2017-10-28 NOTE — ED Triage Notes (Signed)
Pt here from home via EMS. Mechanical fall twice today due to "weakness" per pt. Pt c/o neck pain (towel applied by EMS). Hx of AFIB and parkinsons. NKA. 130/80, 66 HR, 16 resp, 248 CBG

## 2017-10-28 NOTE — ED Notes (Signed)
Patient ambulated in hallway with minimal assistance.

## 2017-10-28 NOTE — ED Provider Notes (Signed)
[facility] EMERGENCY DEPARTMENT Provider Note   CSN: [id number] Arrival date & time: 10/28/17  1724     History   Chief Complaint Chief Complaint  Patient presents with  . Fall    HPI Carrie Short is a 84 y.o. female.  84 yo F with a chief complaint of fall.  Patient had 2 of these today.  She has a history of Parkinson's disease and has had some difficulty ambulating at baseline.  She felt that she lost her balance and then fell onto her face.  The patient complaining of pain to the right side of her face as well as the left side of her neck.  She was unable to get off the floor after this fall and when family came and got her they called 911.  There is some concern that she had blood around her mouth.  They deny any prior vomiting or diarrhea.  Deny any infectious symptoms cough congestion fevers chills or myalgias.  The patient complains of some hip pain as well.  She is unable to localize this.  The history is provided by the patient and a relative.  Fall  This is a new problem. The current episode started 3 to 5 hours ago. The problem occurs rarely. The problem has been resolved. Pertinent negatives include no chest pain, no headaches and no shortness of breath. Nothing aggravates the symptoms. Nothing relieves the symptoms. She has tried nothing for the symptoms. The treatment provided no relief.    Past Medical History:  Diagnosis Date  . Anxiety   . Arthritis    L arm, hips & knees - recently had injection in R  knee - Dr. Lorene Dy   . Brain tumor (benign) ([place]) 2010   has consulted /w Dr. Tommi Rumps at [facility], AUG 2013- had MRI  . Cataract   . Dysrhythmia    "my heart skips some beats"  . GERD (gastroesophageal reflux disease)   . H/O hiatal hernia   . Headache(784.0)    pt. believes the recent headaches are related to "brain tumor", states she has a followup appt. wuith Neurologist in Sept.    . Heart murmur   . High cholesterol   .  Hypertension   . Memory loss   . Parkinsonism ([place])   . Seizures ([place])    prior to diagnosed /w brain tumor, on lamictal since    . Shortness of breath    /w exertion     Patient Active Problem List   Diagnosis Date Noted  . Left ankle injury, subsequent encounter 01/24/2017  . Restless leg syndrome 06/28/2013  . MRI of brain abnormal 12/26/2012  . Complex partial seizures ([place]) 12/26/2012  . Memory loss 12/26/2012  . Esophageal reflux disease 11/22/2010  . Osteoporosis 06/14/2009  . Depressive disorder, not elsewhere classified 06/14/2009  . Acquired hypothyroidism 06/14/2009    Past Surgical History:  Procedure Laterality Date  . ABDOMINAL HYSTERECTOMY  1990s  . BRAIN BIOPSY  2010   L temple  . BREAST BIOPSY Right 12/12/2012   Procedure: Right breast wire guided excision;  Surgeon: Rolm Bookbinder, MD;  Location: [facility];  Service: General;  Laterality: Right;  . BREAST EXCISIONAL BIOPSY Right    No scars seen   . BUNIONECTOMY     left foot  . EYE SURGERY    . KNEE SURGERY Left 2006     OB History   None      Home Medications  Prior to Admission medications   Medication Sig Start Date End Date Taking? Authorizing Provider  alendronate (FOSAMAX) 70 MG tablet Take 70 mg by mouth once a week. Sundays 10/02/17  Yes [provider]  amLODipine (NORVASC) 2.5 MG tablet Take 2.5 mg by mouth daily. 04/25/15  Yes [provider]  Calcium Carbonate-Vitamin D (CALCIUM 600 + D PO) Take 1 tablet by mouth daily.    Yes [provider]  lamoTRIgine (LAMICTAL) 100 MG tablet Take 1 tablet (100 mg total) by mouth daily. 07/12/17  Yes Penumalli, Earlean Polka, MD  LORazepam (ATIVAN) 1 MG tablet Take 1 mg by mouth every 8 (eight) hours as needed for sleep (tremors).    Yes [provider]  metoprolol succinate (TOPROL-XL) 50 MG 24 hr tablet Take 50 mg by mouth daily. 04/25/15  Yes [provider]  Naproxen Sodium (ALEVE PO) Take 220 mg by mouth as  needed (pain).    Yes [provider]  omeprazole (PRILOSEC) 20 MG capsule Take 20 mg by mouth daily. 03/03/15  Yes [provider]  rOPINIRole (REQUIP) 1 MG tablet Take 1 tablet (1 mg total) by mouth 2 (two) times daily. 07/12/17  Yes Penumalli, Earlean Polka, MD  venlafaxine XR (EFFEXOR-XR) 75 MG 24 hr capsule Take 75 mg by mouth daily. 01/08/17  Yes [provider]  traMADol (ULTRAM) 50 MG tablet Take 1 tablet (50 mg total) by mouth every 6 (six) hours as needed. Patient not taking: Reported on 10/28/2017 01/11/17   Fredia Sorrow, MD    Family History Family History  Problem Relation Age of Onset  . Heart disease Mother   . Heart attack Mother   . Cancer Daughter        breast mets to lung  . Breast cancer Daughter   . Diabetes Son   . Colon cancer Father     Social History Social History   Tobacco Use  . Smoking status: Never Smoker  . Smokeless tobacco: Never Used  Substance Use Topics  . Alcohol use: No  . Drug use: No     Allergies   Patient has no known allergies.   Review of Systems Review of Systems  Constitutional: Negative for chills and fever.  HENT: Negative for congestion and rhinorrhea.   Eyes: Negative for redness and visual disturbance.  Respiratory: Negative for shortness of breath and wheezing.   Cardiovascular: Negative for chest pain and palpitations.  Gastrointestinal: Negative for nausea and vomiting.  Genitourinary: Negative for dysuria and urgency.  Musculoskeletal: Positive for arthralgias, myalgias and neck pain.  Skin: Negative for pallor and wound.  Neurological: Negative for dizziness and headaches.     Physical Exam Updated Vital Signs BP (!) 148/67   Pulse 65   Temp (!) 97.5 F (36.4 C) (Oral)   Resp 19   Wt 78.9 kg (174 lb)   SpO2 93%   BMI 30.82 kg/m   Physical Exam  Constitutional: She is oriented to person, place, and time. She appears well-developed and well-nourished. No distress.  HENT:    Head: Normocephalic.  Bruising to the right temporal and right maxillary region.  Extraocular motor is intact.  No midline spinal tenderness.  Eyes: Pupils are equal, round, and reactive to light. EOM are normal.  Neck: Normal range of motion. Neck supple.  Cardiovascular: Normal rate and regular rhythm. Exam reveals no gallop and no friction rub.  No murmur heard. Pulmonary/Chest: Effort normal. She has no wheezes. She has no rales.  Abdominal: Soft. She exhibits no distension. There is no tenderness.  Musculoskeletal: She exhibits no edema or tenderness.  Palpated from head to toe without any noted areas of bony tenderness.  Internal and external rotation of bilateral hips without pain.  Neurological: She is alert and oriented to person, place, and time.  Skin: Skin is warm and dry. She is not diaphoretic.  Psychiatric: She has a normal mood and affect. Her behavior is normal.  Nursing note and vitals reviewed.    ED Treatments / Results  Labs (all labs ordered are listed, but only abnormal results are displayed) Labs Reviewed  CBC WITH DIFFERENTIAL/PLATELET - Abnormal; Notable for the following components:      Result Value   Platelets 110 (*)    All other components within normal limits  COMPREHENSIVE METABOLIC PANEL - Abnormal; Notable for the following components:   Glucose, Bld 169 (*)    Albumin 3.2 (*)    AST 86 (*)    ALT 61 (*)    Alkaline Phosphatase 185 (*)    All other components within normal limits  URINALYSIS, ROUTINE W REFLEX MICROSCOPIC  I-STAT TROPONIN, ED    EKG EKG Interpretation  Date/Time:  Thursday October 28 2017 18:04:06 EDT Ventricular Rate:  66 PR Interval:    QRS Duration: 102 QT Interval:  431 QTC Calculation: 452 R Axis:   2 Text Interpretation:  Sinus rhythm Prolonged PR interval Left ventricular hypertrophy Minimal ST elevation, inferior leads Baseline wander in lead(s) V6 No significant change since last tracing Confirmed by Deno Etienne  [phone]) on 10/28/2017 6:16:11 PM   Radiology Dg Chest 2 View  Result Date: 10/28/2017 CLINICAL DATA:  Weakness with multiple falls today. EXAM: CHEST - 2 VIEW COMPARISON:  Chest CT 05/23/2013 and CXR 09/20/2013 FINDINGS: Top-normal size heart with minimal aortic atherosclerosis. No aneurysm. Moderate-sized hiatal hernia. Mild interstitial edema. No pulmonary consolidation, effusion or pneumothorax. No acute nor suspicious osseous abnormalities. Mild degenerative change along the thoracic spine. IMPRESSION: Borderline cardiomegaly with aortic atherosclerosis. Mild interstitial edema. Moderate-sized hiatal hernia. Electronically Signed   By: Ashley Royalty M.D.   On: 10/28/2017 19:11   Ct Head Wo Contrast  Result Date: 10/28/2017 CLINICAL DATA:  Cervical spine trauma, high clinical risk. Mechanical fall twice a day due to weakness EXAM: CT HEAD WITHOUT CONTRAST CT CERVICAL SPINE WITHOUT CONTRAST TECHNIQUE: Multidetector CT imaging of the head and cervical spine was performed following the standard protocol without intravenous contrast. Multiplanar CT image reconstructions of the cervical spine were also generated. COMPARISON:  07/17/2010 head CT FINDINGS: CT HEAD FINDINGS Brain: No evidence of acute infarction, hemorrhage, hydrocephalus, extra-axial collection or mass lesion/mass effect. Mild for age volume loss, but progressed from 2012. Vascular: No hyperdense vessel or unexpected calcification. Skull: Negative for fracture Sinuses/Orbits: Chronic maxillary sinusitis with sclerotic wall thickening. A fluid level is seen on the right. CT CERVICAL SPINE FINDINGS Alignment: No traumatic malalignment Skull base and vertebrae: Negative for fracture.  T1 hemangioma Soft tissues and spinal canal: No prevertebral fluid or swelling. No visible canal hematoma. Disc levels: Usual degenerative changes. No evidence of cord impingement. Upper chest: Negative IMPRESSION: No evidence of acute intracranial or cervical spine  injury. Electronically Signed   By: Monte Fantasia M.D.   On: 10/28/2017 19:43   Ct Cervical Spine Wo Contrast  Result Date: 10/28/2017 CLINICAL DATA:  Cervical spine trauma, high clinical risk. Mechanical fall twice a day due to weakness EXAM: CT HEAD WITHOUT CONTRAST CT CERVICAL SPINE WITHOUT  CONTRAST TECHNIQUE: Multidetector CT imaging of the head and cervical spine was performed following the standard protocol without intravenous contrast. Multiplanar CT image reconstructions of the cervical spine were also generated. COMPARISON:  07/17/2010 head CT FINDINGS: CT HEAD FINDINGS Brain: No evidence of acute infarction, hemorrhage, hydrocephalus, extra-axial collection or mass lesion/mass effect. Mild for age volume loss, but progressed from 2012. Vascular: No hyperdense vessel or unexpected calcification. Skull: Negative for fracture Sinuses/Orbits: Chronic maxillary sinusitis with sclerotic wall thickening. A fluid level is seen on the right. CT CERVICAL SPINE FINDINGS Alignment: No traumatic malalignment Skull base and vertebrae: Negative for fracture.  T1 hemangioma Soft tissues and spinal canal: No prevertebral fluid or swelling. No visible canal hematoma. Disc levels: Usual degenerative changes. No evidence of cord impingement. Upper chest: Negative IMPRESSION: No evidence of acute intracranial or cervical spine injury. Electronically Signed   By: Monte Fantasia M.D.   On: 10/28/2017 19:43    Procedures Procedures (including critical care time)  Medications Ordered in ED Medications  sodium chloride 0.9 % bolus 1,000 mL (1,000 mLs Intravenous New Bag/Given 10/28/17 1922)     Initial Impression / Assessment and Plan / ED Course  I have reviewed the triage vital signs and the nursing notes.  Pertinent labs & imaging results that were available during my care of the patient were reviewed by me and considered in my medical decision making (see chart for details).     84 yo F with a chief  complaint of a fall.  Patient think she lost her balance due to her Parkinson's.  Will obtain lab evaluation and CT of the head and the C-spine chest x-ray UA. Give a fluid bolus.   Patient feeling better post iv fluids, will ambulate, awaiting UA. If ambulating at baseline feel safe for Dc with PCP follow up + or - abx for uti if needed .  The patients results and plan were reviewed and discussed.   Any x-rays performed were independently reviewed by myself.   Differential diagnosis were considered with the presenting HPI.  Medications  sodium chloride 0.9 % bolus 1,000 mL (1,000 mLs Intravenous New Bag/Given 10/28/17 1922)    Vitals:   10/28/17 1731 10/28/17 1745 10/28/17 2015 10/28/17 2045  BP: 139/67 (!) 119/46  (!) 148/67  Pulse: 70 65 62 65  Resp: 18  19 19   Temp: (!) 97.5 F (36.4 C)     TempSrc: Oral     SpO2: 94% 93% 94% 93%  Weight:        Final diagnoses:  Fall, initial encounter      Final Clinical Impressions(s) / ED Diagnoses   Final diagnoses:  Fall, initial encounter    ED Discharge Orders    None       Deno Etienne, DO 10/28/17 2235

## 2017-10-29 LAB — URINALYSIS, ROUTINE W REFLEX MICROSCOPIC
BILIRUBIN URINE: NEGATIVE
Bacteria, UA: NONE SEEN
GLUCOSE, UA: NEGATIVE mg/dL
KETONES UR: NEGATIVE mg/dL
Leukocytes, UA: NEGATIVE
Nitrite: NEGATIVE
PROTEIN: NEGATIVE mg/dL
Specific Gravity, Urine: 1.011 (ref 1.005–1.030)
pH: 7 (ref 5.0–8.0)

## 2017-10-29 MED ORDER — HYDROCODONE-ACETAMINOPHEN 5-325 MG PO TABS: 1.0000 | ORAL_TABLET | Freq: Four times a day (QID) | ORAL | 0 refills | Status: DC | PRN

## 2017-10-29 NOTE — ED Provider Notes (Signed)
12:11 AM Patient care assumed from Dr. Tyrone Nine at change of shift.  Patient presenting to the ED after a fall.  She was able to ambulate in the ED well.  Ambulated to the restroom and back to her room after providing a urine sample.  Awaiting urinalysis.  Laboratory work-up thus far has been reassuring.  12:50 AM Urinalysis today without evidence of infection.  I have assessed the patient and she confirms that she is feeling better.  I have reviewed her reassuring evaluation.  She expresses comfort with discharge.  She does have slight complaints of persistent neck pain.  Will provide short course of Norco for pain management.  Return precautions discussed and provided. Patient discharged in stable condition with no unaddressed concerns.   Antonietta Breach, PA-C 10/29/17 4301    Ripley Fraise, MD 10/29/17 [phone]

## 2017-10-29 NOTE — Discharge Instructions (Signed)
Your work-up in the emergency department was reassuring.  You may take Norco as needed for pain.  Continue your daily prescribed medications.  We recommend follow-up with your primary care doctor regarding your visit to the emergency department today.  You may return for new or concerning symptoms.

## 2018-01-22 ENCOUNTER — Encounter (HOSPITAL_BASED_OUTPATIENT_CLINIC_OR_DEPARTMENT_OTHER): Payer: Self-pay | Admitting: Adult Health

## 2018-01-22 ENCOUNTER — Other Ambulatory Visit: Payer: Self-pay

## 2018-01-22 ENCOUNTER — Emergency Department (HOSPITAL_BASED_OUTPATIENT_CLINIC_OR_DEPARTMENT_OTHER): Payer: Medicare Other

## 2018-01-22 ENCOUNTER — Emergency Department (HOSPITAL_BASED_OUTPATIENT_CLINIC_OR_DEPARTMENT_OTHER)
Admission: EM | Admit: 2018-01-22 | Discharge: 2018-01-22 | Disposition: A | Discharge status: Home / Self Care | Payer: Medicare Other | Attending: Emergency Medicine | Admitting: Emergency Medicine

## 2018-01-22 DIAGNOSIS — S62614A Displaced fracture of proximal phalanx of right ring finger, initial encounter for closed fracture: Secondary | ICD-10-CM | POA: Insufficient documentation

## 2018-01-22 DIAGNOSIS — I10 Essential (primary) hypertension: Secondary | ICD-10-CM | POA: Insufficient documentation

## 2018-01-22 DIAGNOSIS — Y999 Unspecified external cause status: Secondary | ICD-10-CM | POA: Diagnosis not present

## 2018-01-22 DIAGNOSIS — W19XXXA Unspecified fall, initial encounter: Secondary | ICD-10-CM

## 2018-01-22 DIAGNOSIS — Z79899 Other long term (current) drug therapy: Secondary | ICD-10-CM | POA: Insufficient documentation

## 2018-01-22 DIAGNOSIS — S6991XA Unspecified injury of right wrist, hand and finger(s), initial encounter: Secondary | ICD-10-CM | POA: Diagnosis present

## 2018-01-22 DIAGNOSIS — R51 Headache: Secondary | ICD-10-CM | POA: Insufficient documentation

## 2018-01-22 DIAGNOSIS — S62616A Displaced fracture of proximal phalanx of right little finger, initial encounter for closed fracture: Secondary | ICD-10-CM | POA: Diagnosis not present

## 2018-01-22 DIAGNOSIS — Y9301 Activity, walking, marching and hiking: Secondary | ICD-10-CM | POA: Insufficient documentation

## 2018-01-22 DIAGNOSIS — W108XXA Fall (on) (from) other stairs and steps, initial encounter: Secondary | ICD-10-CM | POA: Insufficient documentation

## 2018-01-22 DIAGNOSIS — E039 Hypothyroidism, unspecified: Secondary | ICD-10-CM | POA: Insufficient documentation

## 2018-01-22 DIAGNOSIS — G20 Parkinson's disease: Secondary | ICD-10-CM | POA: Diagnosis not present

## 2018-01-22 DIAGNOSIS — Y92480 Sidewalk as the place of occurrence of the external cause: Secondary | ICD-10-CM | POA: Insufficient documentation

## 2018-01-22 MED ORDER — OXYCODONE-ACETAMINOPHEN 5-325 MG PO TABS: 1.0000 | ORAL_TABLET | Freq: Four times a day (QID) | ORAL | 0 refills | Status: DC | PRN

## 2018-01-22 MED ORDER — LIDOCAINE HCL (PF) 1 % IJ SOLN
5.0000 mL | Freq: Once | INTRAMUSCULAR | Status: AC
  Administered 2018-01-22: 5 mL
  Filled 2018-01-22: qty 5

## 2018-01-22 MED ORDER — OXYCODONE-ACETAMINOPHEN 5-325 MG PO TABS
1.0000 | ORAL_TABLET | Freq: Once | ORAL | Status: AC
  Administered 2018-01-22: 1 via ORAL
  Filled 2018-01-22: qty 1

## 2018-01-22 NOTE — Discharge Instructions (Addendum)
Please read and follow all provided instructions.  You have been seen today for fall- your head CT and neck CT did not show any injuries from the fall today. Your hand x-ray shows that your 4th/5th fingers are both broken at the proximal phalanx. We have placed you in a splint to stabilize this fracture- we would like you to remain in the splint until you have followed up with hand surgeon Dr. Amedeo Plenty. Please follow up within 1 week.   Home care instructions: -- *PRICE in the first 24-48 hours after injury: Protect- splint provided Rest Ice- Apply ice in towel to over top of the splint. Apply ice for 20 min, then remove for 40 min while awake Compression- using splint applied in ER Elevate affected extremity above the level of your heart when not walking around for the first 24-48 hours   Medications:  -Percocet-this is a narcotic/controlled substance medication that has potential addicting qualities.  We recommend that you take 1-2 tablets every 6 hours as needed for severe pain.  Do not drive or operate heavy machinery when taking this medicine as it can be sedating. Do not drink alcohol or take other sedating medications when taking this medicine for safety reasons.  Keep this out of reach of small children.  Please be aware this medicine has Tylenol in it (325 mg/tab) do not exceed the maximum dose of Tylenol in a day per over the counter recommendations should you decide to supplement with Tylenol over the counter.   We have prescribed you new medication(s) today. Discuss the medications prescribed today with your pharmacist as they can have adverse effects and interactions with your other medicines including over the counter and prescribed medications. Seek medical evaluation if you start to experience new or abnormal symptoms after taking one of these medicines, seek care immediately if you start to experience difficulty breathing, feeling of your throat closing, facial swelling, or rash as these  could be indications of a more serious allergic reaction  Return instructions:  Please return if your digits or extremity are numb or tingling, appear gray or blue, or you have severe pain (also elevate the extremity and loosen splint or wrap if you were given one) Please return if you have redness or fevers.  Please return to the Emergency Department if you experience worsening symptoms.  Please return if you have any other emergent concerns. Additional Information:  Your vital signs today were: BP (!) 158/105    Pulse 99    Temp 98.3 F (36.8 C) (Oral)    Resp 18    SpO2 92%  If your blood pressure (BP) was elevated above 135/85 this visit, please have this repeated by your doctor within one month. ---------------

## 2018-01-22 NOTE — ED Triage Notes (Signed)
PResents with right hand pain, right sided forehead pain, right ring and pinky finger bruising and deformity post fall from her deck down one step. Denies LOC. Pt has Parkinsons

## 2018-01-22 NOTE — ED Notes (Signed)
PA in to check splint

## 2018-01-22 NOTE — ED Provider Notes (Signed)
[facility] EMERGENCY DEPARTMENT Provider Note   CSN: [id number] Arrival date & time: 01/22/18  1419     History   Chief Complaint Chief Complaint  Patient presents with  . Fall    HPI Carrie Short is a 84 y.o. female with a history of hypertension, hyperlipidemia, Parkinson's, seizures, and memory loss who presents to the emergency department status post fall just PTA with complaints of headache and R hand pain. Patient states she has a hx of parkinsons which has lead to her having balance issues with subsequent falls in the past- she states today she had a similar fall today during which she miss-stepped, lost balance, and subsequently fell to the right down 1 step onto a grassy area. She states she broke her fall with her R hand but did still hit the R side of her head- denies LOC. She needed assistance getting up, but has ambulated with her walker which she uses at baseline since the fall. She reports she is having pain to the R side of the head and the R 4th/5th fingers with swelling/bruising to each area. She rates her current pain an 8/10 in severity, worse with movement, no alleviating factors. Denies numbness, weakness, paresthesias, back pain, chest pain, dyspnea, or abdominal pain. Denies chest pain, dyspnea, lightheadedness, dizziness, or syncope prior to fall or following.  Patient is right-hand dominant.  HPI  Past Medical History:  Diagnosis Date  . Anxiety   . Arthritis    L arm, hips & knees - recently had injection in R  knee - Dr. Lorene Dy   . Brain tumor (benign) ([place]) 2010   has consulted /w Dr. Tommi Rumps at [facility], AUG 2013- had MRI  . Cataract   . Dysrhythmia    "my heart skips some beats"  . GERD (gastroesophageal reflux disease)   . H/O hiatal hernia   . Headache(784.0)    pt. believes the recent headaches are related to "brain tumor", states she has a followup appt. wuith Neurologist in Sept.    . Heart murmur   . High cholesterol   .  Hypertension   . Memory loss   . Parkinsonism ([place])   . Seizures ([place])    prior to diagnosed /w brain tumor, on lamictal since    . Shortness of breath    /w exertion     Patient Active Problem List   Diagnosis Date Noted  . Left ankle injury, subsequent encounter 01/24/2017  . Restless leg syndrome 06/28/2013  . MRI of brain abnormal 12/26/2012  . Complex partial seizures ([place]) 12/26/2012  . Memory loss 12/26/2012  . Esophageal reflux disease 11/22/2010  . Osteoporosis 06/14/2009  . Depressive disorder, not elsewhere classified 06/14/2009  . Acquired hypothyroidism 06/14/2009    Past Surgical History:  Procedure Laterality Date  . ABDOMINAL HYSTERECTOMY  1990s  . BRAIN BIOPSY  2010   L temple  . BREAST BIOPSY Right 12/12/2012   Procedure: Right breast wire guided excision;  Surgeon: Rolm Bookbinder, MD;  Location: [facility];  Service: General;  Laterality: Right;  . BREAST EXCISIONAL BIOPSY Right    No scars seen   . BUNIONECTOMY     left foot  . EYE SURGERY    . KNEE SURGERY Left 2006     OB History   None      Home Medications    Prior to Admission medications   Medication Sig Start Date End Date Taking? Authorizing Provider  alendronate (FOSAMAX) 70 MG  tablet Take 70 mg by mouth once a week. Sundays 10/02/17   [provider]  amLODipine (NORVASC) 2.5 MG tablet Take 2.5 mg by mouth daily. 04/25/15   [provider]  Calcium Carbonate-Vitamin D (CALCIUM 600 + D PO) Take 1 tablet by mouth daily.     [provider]  HYDROcodone-acetaminophen (NORCO/VICODIN) 5-325 MG tablet Take 1 tablet by mouth every 6 (six) hours as needed for severe pain. 10/29/17   Antonietta Breach, PA-C  lamoTRIgine (LAMICTAL) 100 MG tablet Take 1 tablet (100 mg total) by mouth daily. 07/12/17   Penumalli, Earlean Polka, MD  LORazepam (ATIVAN) 1 MG tablet Take 1 mg by mouth every 8 (eight) hours as needed for sleep (tremors).     [provider]  metoprolol succinate  (TOPROL-XL) 50 MG 24 hr tablet Take 50 mg by mouth daily. 04/25/15   [provider]  Naproxen Sodium (ALEVE PO) Take 220 mg by mouth as needed (pain).     [provider]  omeprazole (PRILOSEC) 20 MG capsule Take 20 mg by mouth daily. 03/03/15   [provider]  rOPINIRole (REQUIP) 1 MG tablet Take 1 tablet (1 mg total) by mouth 2 (two) times daily. 07/12/17   Penumalli, Earlean Polka, MD  venlafaxine XR (EFFEXOR-XR) 75 MG 24 hr capsule Take 75 mg by mouth daily. 01/08/17   [provider]    Family History Family History  Problem Relation Age of Onset  . Heart disease Mother   . Heart attack Mother   . Cancer Daughter        breast mets to lung  . Breast cancer Daughter   . Diabetes Son   . Colon cancer Father     Social History Social History   Tobacco Use  . Smoking status: Never Smoker  . Smokeless tobacco: Never Used  Substance Use Topics  . Alcohol use: No  . Drug use: No     Allergies   Patient has no known allergies.   Review of Systems Review of Systems  Constitutional: Negative for chills and fever.  Respiratory: Negative for shortness of breath.   Cardiovascular: Negative for chest pain and palpitations.  Gastrointestinal: Negative for abdominal pain, nausea and vomiting.  Musculoskeletal: Positive for arthralgias (R 4th/5th digit of RUE). Negative for back pain and neck pain.  Neurological: Positive for headaches. Negative for dizziness, seizures, weakness, light-headedness and numbness.  All other systems reviewed and are negative.    Physical Exam Updated Vital Signs BP (!) 174/89   Pulse 95   Temp 98.3 F (36.8 C) (Oral)   Resp 18   SpO2 93%   Physical Exam  Constitutional: She appears well-developed and well-nourished. No distress.  HENT:  Head: Head is without raccoon's eyes and without Battle's sign.    Right Ear: No drainage. No hemotympanum.  Left Ear: No drainage. No hemotympanum.  Nose: No rhinorrhea.    Mouth/Throat: Uvula is midline and oropharynx is clear and moist.  Eyes: Pupils are equal, round, and reactive to light. Conjunctivae and EOM are normal. Right eye exhibits no discharge. Left eye exhibits no discharge.  Neck: Normal range of motion. No spinous process tenderness present.  Cardiovascular: Normal rate and regular rhythm.  No murmur heard. Pulmonary/Chest: Breath sounds normal. No respiratory distress. She has no wheezes. She has no rales.  Abdominal: Soft. She exhibits no distension. There is no tenderness.  Musculoskeletal:  Upper extremities: Patient has ecchymosis as well as swelling to the right fourth  and fifth digits including areas of the metacarpals, MCPs, proximal phalanx, extending to PIP and middle phalanx.  There appears to be somewhat of a deformity to the 4th digit with the PIP joint being held in flexion.  She has full active range of motion to all joints with the exception of the joints of the fourth and fifth digits, she is able to somewhat flex and extend up with the MCP and IP joints, however this is able to be done minimally.  She is tender palpation over the fourth/fifth metacarpal, MCP, as well as all IP joints and phalanges.  No snuffbox tenderness.  No other areas of tenderness to the upper extremities: Back: No midline tenderness.  No palpable step-off. Lower extremities: Normal range of motion.  Nontender.  Neurological:  Clear speech. CNIII-XII grossly intact. Sensation grossly intact x 4. Able to lift both legs off of the bed. Difficult to assess grip strength in RUE secondary to pain. Ambulatory with walker- family reports this appears baseline.   Skin: Skin is warm and dry. Capillary refill takes less than 2 seconds. No rash noted.  Psychiatric: She has a normal mood and affect. Her behavior is normal.  Nursing note and vitals reviewed.      ED Treatments / Results  Labs (all labs ordered are listed, but only abnormal results are displayed) Labs  Reviewed - No data to display  EKG None  Radiology Ct Head Wo Contrast  Result Date: 01/22/2018 CLINICAL DATA:  Fall, hit right side of head.  Neck pain EXAM: CT HEAD WITHOUT CONTRAST CT CERVICAL SPINE WITHOUT CONTRAST TECHNIQUE: Multidetector CT imaging of the head and cervical spine was performed following the standard protocol without intravenous contrast. Multiplanar CT image reconstructions of the cervical spine were also generated. COMPARISON:  None. FINDINGS: CT HEAD FINDINGS Brain: There is atrophy and chronic small vessel disease changes. No acute intracranial abnormality. Specifically, no hemorrhage, hydrocephalus, mass lesion, acute infarction, or significant intracranial injury. Vascular: No hyperdense vessel or unexpected calcification. Skull: No acute calvarial abnormality. Sinuses/Orbits: Visualized paranasal sinuses and mastoids clear. Orbital soft tissues unremarkable. Other: None CT CERVICAL SPINE FINDINGS Alignment: No subluxation Skull base and vertebrae: No acute fracture. No primary bone lesion or focal pathologic process. Soft tissues and spinal canal: No prevertebral fluid or swelling. No visible canal hematoma. Disc levels: Degenerative disc and facet disease throughout the cervical spine. Upper chest: No acute findings Other: Carotid artery calcifications.  No acute findings IMPRESSION: No acute intracranial abnormality. Atrophy, chronic microvascular disease. No acute bony abnormality in the cervical spine. Degenerative disc and facet disease. Electronically Signed   By: Rolm Baptise M.D.   On: 01/22/2018 15:20   Ct Cervical Spine Wo Contrast  Result Date: 01/22/2018 CLINICAL DATA:  Fall, hit right side of head.  Neck pain EXAM: CT HEAD WITHOUT CONTRAST CT CERVICAL SPINE WITHOUT CONTRAST TECHNIQUE: Multidetector CT imaging of the head and cervical spine was performed following the standard protocol without intravenous contrast. Multiplanar CT image reconstructions of the  cervical spine were also generated. COMPARISON:  None. FINDINGS: CT HEAD FINDINGS Brain: There is atrophy and chronic small vessel disease changes. No acute intracranial abnormality. Specifically, no hemorrhage, hydrocephalus, mass lesion, acute infarction, or significant intracranial injury. Vascular: No hyperdense vessel or unexpected calcification. Skull: No acute calvarial abnormality. Sinuses/Orbits: Visualized paranasal sinuses and mastoids clear. Orbital soft tissues unremarkable. Other: None CT CERVICAL SPINE FINDINGS Alignment: No subluxation Skull base and vertebrae: No acute fracture. No primary bone lesion or focal  pathologic process. Soft tissues and spinal canal: No prevertebral fluid or swelling. No visible canal hematoma. Disc levels: Degenerative disc and facet disease throughout the cervical spine. Upper chest: No acute findings Other: Carotid artery calcifications.  No acute findings IMPRESSION: No acute intracranial abnormality. Atrophy, chronic microvascular disease. No acute bony abnormality in the cervical spine. Degenerative disc and facet disease. Electronically Signed   By: Rolm Baptise M.D.   On: 01/22/2018 15:20   Dg Hand Complete Right  Result Date: 01/22/2018 CLINICAL DATA:  84 year old female status post fall down steps presents with fourth and fifth digit pain. EXAM: RIGHT HAND - COMPLETE 3+ VIEW COMPARISON:  None. FINDINGS: Acute closed fractures at the base of the right fourth proximal phalanx as well as base and neck of the fifth proximal phalanx with ulnar angulation of the distal fracture fragments. No intra-articular extension of fracture. Diffuse osteopenic appearance of the right hand and wrist with diffuse osteoarthritic change of all the digits, base of thumb metacarpal and triscaphe joint of the wrist. Soft tissue swelling secondary to fracture is identified about the fourth and fifth MCP joints as well as fingers. IMPRESSION: Acute, closed right fourth and fifth  proximal phalangeal fractures with ulnar angulation of the distal main fracture fragments. Electronically Signed   By: Ashley Royalty M.D.   On: 01/22/2018 15:49    Procedures Reduction of fracture Date/Time: 01/22/2018 4:50 PM Performed by: Amaryllis Dyke, PA-C Authorized by: Amaryllis Dyke, PA-C  Consent: Verbal consent obtained. Risks and benefits: risks, benefits and alternatives were discussed Consent given by: patient Patient identity confirmed: verbally with patient Local anesthesia used: yes Anesthesia: digital block  Anesthesia: Local anesthesia used: yes Local Anesthetic: lidocaine 1% without epinephrine Anesthetic total: 3 mL  Sedation: Patient sedated: no  Patient tolerance: Patient tolerated the procedure well with no immediate complications Comments: L 4th proximal phalanx fracture reduction.   Archie Endo Block Date/Time: 01/22/2018 4:45 PM Performed by: Amaryllis Dyke, PA-C Authorized by: Amaryllis Dyke, PA-C   Consent:    Consent obtained:  Verbal   Consent given by:  Patient   Risks discussed:  Bleeding, allergic reaction, infection, intravenous injection, nerve damage, pain and unsuccessful block   Alternatives discussed:  Alternative treatment Indications:    Indications:  Procedural anesthesia Location:    Body area:  Upper extremity   Upper extremity nerve blocked: 4th finger digital block.   Laterality:  Right Pre-procedure details:    Skin preparation:  Alcohol Procedure details (see MAR for exact dosages):    Block needle gauge:  27 G   Anesthetic injected:  Lidocaine 1% w/o epi   Injection procedure:  Anatomic landmarks identified, incremental injection, negative aspiration for blood, anatomic landmarks palpated and introduced needle Post-procedure details:    Outcome:  Pain relieved   Patient tolerance of procedure:  Tolerated well, no immediate complications    (including critical care time)  SPLINT  APPLICATION Date/Time: 3:26 PM Authorized by: Kennith Maes Consent: Verbal consent obtained. Risks and benefits: risks, benefits and alternatives were discussed Consent given by: patient Splint applied by: ED technician Location details: RUE Splint type: posterior wrist to 4th/5th digits Post-procedure: The splinted body part was neurovascularly unchanged following the procedure. Patient tolerance: Patient tolerated the procedure well with no immediate complications.   Medications Ordered in ED Medications  lidocaine (PF) (XYLOCAINE) 1 % injection 5 mL (5 mLs Infiltration Given by Other 01/22/18 1621)  oxyCODONE-acetaminophen (PERCOCET/ROXICET) 5-325 MG per tablet 1 tablet (1 tablet Oral  Given 01/22/18 1621)     Initial Impression / Assessment and Plan / ED Course  I have reviewed the triage vital signs and the nursing notes.  Pertinent labs & imaging results that were available during my care of the patient were reviewed by me and considered in my medical decision making (see chart for details).   Patient presents to the emergency department status post fall with complaints of headache and right fourth//fifth finger pain.  The patient has a history of similar falls consistent with issues with her Parkinson's- no prodromal dyspnea/chest pain/dizziness/lightheadedess, no syncope.  There does not appear to be evidence of serious head, neck, or back injury.  CT head/neck obtained without acute intracranial abnormality or notable fracture/dislocation.  She has no tenderness to the thoracic or lumbar area.  Her R hand X-ray reveals acute, closed right fourth and fifth proximal phalangeal fractures with ulnar angulation of the distal main fracture fragments.  Right fourth proximal phalanx fracture reduced.  Posterior splint applied to 4th/5th digits.  Patient neurovascularly intact distally.  Will provide short course of Percocet for pain with hand surgery/orthopedics follow-up. [place] Controlled Substance reporting System queried. I discussed results, treatment plan, need for follow-up, and return precautions with the patient as well as her son at bedside. Provided opportunity for questions, patient and her son confirmed understanding and are in agreement with plan.   Findings and plan of care discussed with supervising physician Dr. Maryan Rued who personally evaluated and examined this patient and is in agreement.   Final Clinical Impressions(s) / ED Diagnoses   Final diagnoses:  Fall, initial encounter  Closed displaced fracture of proximal phalanx of right little finger, initial encounter  Closed displaced fracture of proximal phalanx of right ring finger, initial encounter    ED Discharge Orders         Ordered    oxyCODONE-acetaminophen (PERCOCET/ROXICET) 5-325 MG tablet  Every 6 hours PRN     01/22/18 [address], [place] R, PA-C 01/22/18 1944    Blanchie Dessert, MD 01/23/18 2154

## 2018-01-23 ENCOUNTER — Other Ambulatory Visit: Payer: Self-pay

## 2018-01-23 ENCOUNTER — Emergency Department (HOSPITAL_BASED_OUTPATIENT_CLINIC_OR_DEPARTMENT_OTHER)
Admission: EM | Admit: 2018-01-23 | Discharge: 2018-01-23 | Disposition: A | Discharge status: Home / Self Care | Payer: Medicare Other | Attending: Emergency Medicine | Admitting: Emergency Medicine

## 2018-01-23 ENCOUNTER — Encounter (HOSPITAL_BASED_OUTPATIENT_CLINIC_OR_DEPARTMENT_OTHER): Payer: Self-pay | Admitting: Emergency Medicine

## 2018-01-23 DIAGNOSIS — S62614D Displaced fracture of proximal phalanx of right ring finger, subsequent encounter for fracture with routine healing: Secondary | ICD-10-CM | POA: Insufficient documentation

## 2018-01-23 DIAGNOSIS — W19XXXD Unspecified fall, subsequent encounter: Secondary | ICD-10-CM | POA: Insufficient documentation

## 2018-01-23 DIAGNOSIS — E039 Hypothyroidism, unspecified: Secondary | ICD-10-CM | POA: Insufficient documentation

## 2018-01-23 DIAGNOSIS — S62616D Displaced fracture of proximal phalanx of right little finger, subsequent encounter for fracture with routine healing: Secondary | ICD-10-CM

## 2018-01-23 DIAGNOSIS — I10 Essential (primary) hypertension: Secondary | ICD-10-CM | POA: Insufficient documentation

## 2018-01-23 DIAGNOSIS — G20 Parkinson's disease: Secondary | ICD-10-CM | POA: Insufficient documentation

## 2018-01-23 DIAGNOSIS — Z79899 Other long term (current) drug therapy: Secondary | ICD-10-CM | POA: Diagnosis not present

## 2018-01-23 NOTE — ED Provider Notes (Signed)
[facility] EMERGENCY DEPARTMENT Provider Note   CSN: [id number] Arrival date & time: 01/23/18  1142   History   Chief Complaint Chief Complaint  Patient presents with  . Joint Swelling    HPI Carrie Short is a 84 y.o. female who returns to the emergency department with her son due to right upper extremity digit swelling status post splint application at ER visit yesterday.  She was seen by me yesterday status post fall found to have closed right fourth/fifth proximal phalangeal fractures with ulnar angulation of the distal main fracture fragments.  Her fourth digit fracture was reduced.  She was placed in a posterior splint and discharged home with Percocet instructions for PRICE and hand surgery follow-up. She states she has been taking percocet at home and applying ice with some relief of pain. She relays that the 1st-3rd digits appeared more swollen (not painful) which prompted her return for re-evaluation. She reports she wanted to have the area rechecked given the swelling had increased. She has not removed the splint. Denies new areas of pain, numbness, tingling, or weakness. No subsequent injuries.   HPI  Past Medical History:  Diagnosis Date  . Anxiety   . Arthritis    L arm, hips & knees - recently had injection in R  knee - Dr. Lorene Dy   . Brain tumor (benign) ([place]) 2010   has consulted /w Dr. Tommi Rumps at [facility], AUG 2013- had MRI  . Cataract   . Dysrhythmia    "my heart skips some beats"  . GERD (gastroesophageal reflux disease)   . H/O hiatal hernia   . Headache(784.0)    pt. believes the recent headaches are related to "brain tumor", states she has a followup appt. wuith Neurologist in Sept.    . Heart murmur   . High cholesterol   . Hypertension   . Memory loss   . Parkinsonism ([place])   . Seizures ([place])    prior to diagnosed /w brain tumor, on lamictal since    . Shortness of breath    /w exertion     Patient Active Problem List   Diagnosis  Date Noted  . Left ankle injury, subsequent encounter 01/24/2017  . Restless leg syndrome 06/28/2013  . MRI of brain abnormal 12/26/2012  . Complex partial seizures ([place]) 12/26/2012  . Memory loss 12/26/2012  . Esophageal reflux disease 11/22/2010  . Osteoporosis 06/14/2009  . Depressive disorder, not elsewhere classified 06/14/2009  . Acquired hypothyroidism 06/14/2009    Past Surgical History:  Procedure Laterality Date  . ABDOMINAL HYSTERECTOMY  1990s  . BRAIN BIOPSY  2010   L temple  . BREAST BIOPSY Right 12/12/2012   Procedure: Right breast wire guided excision;  Surgeon: Rolm Bookbinder, MD;  Location: [facility];  Service: General;  Laterality: Right;  . BREAST EXCISIONAL BIOPSY Right    No scars seen   . BUNIONECTOMY     left foot  . EYE SURGERY    . KNEE SURGERY Left 2006     OB History   None      Home Medications    Prior to Admission medications   Medication Sig Start Date End Date Taking? Authorizing Provider  alendronate (FOSAMAX) 70 MG tablet Take 70 mg by mouth once a week. Sundays 10/02/17   [provider]  amLODipine (NORVASC) 2.5 MG tablet Take 2.5 mg by mouth daily. 04/25/15   [provider]  Calcium Carbonate-Vitamin D (CALCIUM 600 + D PO) Take  1 tablet by mouth daily.     [provider]  HYDROcodone-acetaminophen (NORCO/VICODIN) 5-325 MG tablet Take 1 tablet by mouth every 6 (six) hours as needed for severe pain. 10/29/17   Antonietta Breach, PA-C  lamoTRIgine (LAMICTAL) 100 MG tablet Take 1 tablet (100 mg total) by mouth daily. 07/12/17   Penumalli, Earlean Polka, MD  LORazepam (ATIVAN) 1 MG tablet Take 1 mg by mouth every 8 (eight) hours as needed for sleep (tremors).     [provider]  metoprolol succinate (TOPROL-XL) 50 MG 24 hr tablet Take 50 mg by mouth daily. 04/25/15   [provider]  Naproxen Sodium (ALEVE PO) Take 220 mg by mouth as needed (pain).     [provider]  omeprazole (PRILOSEC) 20 MG  capsule Take 20 mg by mouth daily. 03/03/15   [provider]  oxyCODONE-acetaminophen (PERCOCET/ROXICET) 5-325 MG tablet Take 1-2 tablets by mouth every 6 (six) hours as needed for severe pain. 01/22/18   Zadyn Yardley R, PA-C  rOPINIRole (REQUIP) 1 MG tablet Take 1 tablet (1 mg total) by mouth 2 (two) times daily. 07/12/17   Penumalli, Earlean Polka, MD  venlafaxine XR (EFFEXOR-XR) 75 MG 24 hr capsule Take 75 mg by mouth daily. 01/08/17   [provider]    Family History Family History  Problem Relation Age of Onset  . Heart disease Mother   . Heart attack Mother   . Cancer Daughter        breast mets to lung  . Breast cancer Daughter   . Diabetes Son   . Colon cancer Father     Social History Social History   Tobacco Use  . Smoking status: Never Smoker  . Smokeless tobacco: Never Used  Substance Use Topics  . Alcohol use: No  . Drug use: No     Allergies   Patient has no known allergies.   Review of Systems Review of Systems  Musculoskeletal: Positive for joint swelling.  Neurological: Negative for weakness and numbness.       Negative for paresthesias.      Physical Exam Updated Vital Signs BP 132/83 (BP Location: Left Arm)   Pulse 91   Temp 98.3 F (36.8 C) (Oral)   Resp 18   Ht 5\' 3"  (1.6 m)   Wt 77.6 kg   SpO2 91%   BMI 30.29 kg/m   Physical Exam  Constitutional: She appears well-developed and well-nourished. No distress.  HENT:  Head:    Eyes: Conjunctivae are normal. Right eye exhibits no discharge. Left eye exhibits no discharge.  Cardiovascular:  Pulses:      Radial pulses are 2+ on the right side, and 2+ on the left side.  Musculoskeletal:  Splint in place was removed for further assessment.  RUE: There is substantial swelling and ecchymosis to the MCP joint area of digits 2-5 both dorsally and ventrally as pictured below, swelling/ecchymosis extends to the 4th/5th digits more so to palmar aspect. 4th/5th  MCPs/IPs/phalanges are tender to palpation. 3rd MCP somewhat tender as well. Remaining IP and phalanges are nontender. She has full AROM to the 1st-3rd digits with some preserved AROM of the 4th/5th digits. Painless passive ROM. Compartments are soft. Brisk capillary refill to all digits with intact strength/sensation.   Neurological: She is alert.  Clear speech.   Skin: Capillary refill takes less than 2 seconds.  Psychiatric: She has a normal mood and affect. Her behavior is normal. Thought content normal.  Nursing note and  vitals reviewed.         ED Treatments / Results  Labs (all labs ordered are listed, but only abnormal results are displayed) Labs Reviewed - No data to display  EKG None  Radiology Ct Head Wo Contrast  Result Date: 01/22/2018 CLINICAL DATA:  Fall, hit right side of head.  Neck pain EXAM: CT HEAD WITHOUT CONTRAST CT CERVICAL SPINE WITHOUT CONTRAST TECHNIQUE: Multidetector CT imaging of the head and cervical spine was performed following the standard protocol without intravenous contrast. Multiplanar CT image reconstructions of the cervical spine were also generated. COMPARISON:  None. FINDINGS: CT HEAD FINDINGS Brain: There is atrophy and chronic small vessel disease changes. No acute intracranial abnormality. Specifically, no hemorrhage, hydrocephalus, mass lesion, acute infarction, or significant intracranial injury. Vascular: No hyperdense vessel or unexpected calcification. Skull: No acute calvarial abnormality. Sinuses/Orbits: Visualized paranasal sinuses and mastoids clear. Orbital soft tissues unremarkable. Other: None CT CERVICAL SPINE FINDINGS Alignment: No subluxation Skull base and vertebrae: No acute fracture. No primary bone lesion or focal pathologic process. Soft tissues and spinal canal: No prevertebral fluid or swelling. No visible canal hematoma. Disc levels: Degenerative disc and facet disease throughout the cervical spine. Upper chest: No acute  findings Other: Carotid artery calcifications.  No acute findings IMPRESSION: No acute intracranial abnormality. Atrophy, chronic microvascular disease. No acute bony abnormality in the cervical spine. Degenerative disc and facet disease. Electronically Signed   By: Rolm Baptise M.D.   On: 01/22/2018 15:20   Ct Cervical Spine Wo Contrast  Result Date: 01/22/2018 CLINICAL DATA:  Fall, hit right side of head.  Neck pain EXAM: CT HEAD WITHOUT CONTRAST CT CERVICAL SPINE WITHOUT CONTRAST TECHNIQUE: Multidetector CT imaging of the head and cervical spine was performed following the standard protocol without intravenous contrast. Multiplanar CT image reconstructions of the cervical spine were also generated. COMPARISON:  None. FINDINGS: CT HEAD FINDINGS Brain: There is atrophy and chronic small vessel disease changes. No acute intracranial abnormality. Specifically, no hemorrhage, hydrocephalus, mass lesion, acute infarction, or significant intracranial injury. Vascular: No hyperdense vessel or unexpected calcification. Skull: No acute calvarial abnormality. Sinuses/Orbits: Visualized paranasal sinuses and mastoids clear. Orbital soft tissues unremarkable. Other: None CT CERVICAL SPINE FINDINGS Alignment: No subluxation Skull base and vertebrae: No acute fracture. No primary bone lesion or focal pathologic process. Soft tissues and spinal canal: No prevertebral fluid or swelling. No visible canal hematoma. Disc levels: Degenerative disc and facet disease throughout the cervical spine. Upper chest: No acute findings Other: Carotid artery calcifications.  No acute findings IMPRESSION: No acute intracranial abnormality. Atrophy, chronic microvascular disease. No acute bony abnormality in the cervical spine. Degenerative disc and facet disease. Electronically Signed   By: Rolm Baptise M.D.   On: 01/22/2018 15:20   Dg Hand Complete Right  Result Date: 01/22/2018 CLINICAL DATA:  84 year old female status post fall down  steps presents with fourth and fifth digit pain. EXAM: RIGHT HAND - COMPLETE 3+ VIEW COMPARISON:  None. FINDINGS: Acute closed fractures at the base of the right fourth proximal phalanx as well as base and neck of the fifth proximal phalanx with ulnar angulation of the distal fracture fragments. No intra-articular extension of fracture. Diffuse osteopenic appearance of the right hand and wrist with diffuse osteoarthritic change of all the digits, base of thumb metacarpal and triscaphe joint of the wrist. Soft tissue swelling secondary to fracture is identified about the fourth and fifth MCP joints as well as fingers. IMPRESSION: Acute, closed right fourth and fifth proximal  phalangeal fractures with ulnar angulation of the distal main fracture fragments. Electronically Signed   By: Ashley Royalty M.D.   On: 01/22/2018 15:49    Procedures Procedures (including critical care time)  Medications Ordered in ED Medications - No data to display   Initial Impression / Assessment and Plan / ED Course  I have reviewed the triage vital signs and the nursing notes.  Pertinent labs & imaging results that were available during my care of the patient were reviewed by me and considered in my medical decision making (see chart for details).   Patient returns to ED for recheck of RUE s/p 4th/5th proximal phalange fractures splinted at ED visit yesterday. Splint was removed for re-assessment. There is substantial bruising and swelling to the metacarpal area to digits 2-5 extending to the 4th/5th digits. She has intact sensation/strength, brisk capillary refill, and soft compartments, do not suspect compartment syndrome. Expected bruising/swelling present with known injuries. Splint re-applied somewhat looser for comfort by EDT. Will discharge home with continued PRICE and percocet with hand surgery follow up. I discussed treatment plan, need for  follow-up, and return precautions with the patient. Provided opportunity for  questions, patient confirmed understanding and is in agreement with plan.   Findings and plan of care discussed with supervising physician Dr. Kathrynn Humble who personally evaluated and examined this patient  Final Clinical Impressions(s) / ED Diagnoses   Final diagnoses:  Closed displaced fracture of proximal phalanx of right ring finger with routine healing, subsequent encounter  Closed displaced fracture of proximal phalanx of right little finger with routine healing, subsequent encounter    ED Discharge Orders    None       Amaryllis Dyke, PA-C 01/23/18 Charlotte Limones, Ankit, MD 01/27/18 1551

## 2018-01-23 NOTE — Discharge Instructions (Addendum)
You were seen in the ER today for swelling to your fingers after injury with fractures yesterday. The swelling and bruising to your hand is as expected following your injuries, this will improve over time with continued compression, elevation, and ice. We have re-applied the splint somewhat looser to help with this. Please continue to take percocet as needed for pain. Please follow up with the hand surgery as discussed. Return to The ER for new or worsening symptoms including but not limited to increased pain, numbness to the fingers, the fingers appear blue, or any other concerns you may have.

## 2018-01-23 NOTE — ED Triage Notes (Signed)
Pt seen here yesterday for broken fingers. Pt has splint off, family reports other fingers are now swollen.

## 2018-01-31 ENCOUNTER — Ambulatory Visit: Payer: Medicare Other | Admitting: Family Medicine

## 2018-05-17 ENCOUNTER — Encounter (HOSPITAL_BASED_OUTPATIENT_CLINIC_OR_DEPARTMENT_OTHER): Payer: Self-pay | Admitting: Emergency Medicine

## 2018-05-17 ENCOUNTER — Emergency Department (HOSPITAL_BASED_OUTPATIENT_CLINIC_OR_DEPARTMENT_OTHER)
Admission: EM | Admit: 2018-05-17 | Discharge: 2018-05-17 | Disposition: A | Discharge status: Home / Self Care | Payer: Medicare Other | Attending: Emergency Medicine | Admitting: Emergency Medicine

## 2018-05-17 ENCOUNTER — Emergency Department (HOSPITAL_BASED_OUTPATIENT_CLINIC_OR_DEPARTMENT_OTHER): Payer: Medicare Other

## 2018-05-17 ENCOUNTER — Other Ambulatory Visit: Payer: Self-pay

## 2018-05-17 DIAGNOSIS — I10 Essential (primary) hypertension: Secondary | ICD-10-CM | POA: Diagnosis not present

## 2018-05-17 DIAGNOSIS — G20 Parkinson's disease: Secondary | ICD-10-CM | POA: Diagnosis not present

## 2018-05-17 DIAGNOSIS — S299XXA Unspecified injury of thorax, initial encounter: Secondary | ICD-10-CM | POA: Diagnosis present

## 2018-05-17 DIAGNOSIS — S2242XA Multiple fractures of ribs, left side, initial encounter for closed fracture: Secondary | ICD-10-CM | POA: Insufficient documentation

## 2018-05-17 DIAGNOSIS — Y998 Other external cause status: Secondary | ICD-10-CM | POA: Diagnosis not present

## 2018-05-17 DIAGNOSIS — Z79899 Other long term (current) drug therapy: Secondary | ICD-10-CM | POA: Insufficient documentation

## 2018-05-17 DIAGNOSIS — Y92019 Unspecified place in single-family (private) house as the place of occurrence of the external cause: Secondary | ICD-10-CM | POA: Insufficient documentation

## 2018-05-17 DIAGNOSIS — Y939 Activity, unspecified: Secondary | ICD-10-CM | POA: Insufficient documentation

## 2018-05-17 DIAGNOSIS — W19XXXA Unspecified fall, initial encounter: Secondary | ICD-10-CM | POA: Diagnosis not present

## 2018-05-17 MED ORDER — OXYCODONE-ACETAMINOPHEN 5-325 MG PO TABS: 1.0000 | ORAL_TABLET | ORAL | 0 refills | Status: AC | PRN

## 2018-05-17 NOTE — ED Triage Notes (Signed)
PT had fall at home, tripped and fell. Left rib pain. Denies SOB or LOC.

## 2018-05-17 NOTE — ED Provider Notes (Signed)
[facility] EMERGENCY DEPARTMENT Provider Note   CSN: [id number] Arrival date & time: 05/17/18  1525     History   Chief Complaint Chief Complaint  Patient presents with  . Fall    HPI Carrie Short is a 85 y.o. female who presents with a fall and left sided rib pain.  Past medical history significant for Parkinson's disease, frequent falls, hypertension, hyperlipidemia, arthritis.  The patient's daughter is at bedside.  Patient states that about 2 days ago she had a fall at home.  She fell onto her left side.  She notes that she did hit her head but she did not lose consciousness.  She denies headache or dizziness.  She has had persistent left-sided rib cage pain which is worse with lying on that side and breathing.  She is not on blood thinners.  She has been taking Aleve for pain without significant relief.  HPI  Past Medical History:  Diagnosis Date  . Anxiety   . Arthritis    L arm, hips & knees - recently had injection in R  knee - Dr. Lorene Dy   . Brain tumor (benign) ([place]) 2010   has consulted /w Dr. Tommi Rumps at [facility], AUG 2013- had MRI  . Cataract   . Dysrhythmia    "my heart skips some beats"  . GERD (gastroesophageal reflux disease)   . H/O hiatal hernia   . Headache(784.0)    pt. believes the recent headaches are related to "brain tumor", states she has a followup appt. wuith Neurologist in Sept.    . Heart murmur   . High cholesterol   . Hypertension   . Memory loss   . Parkinsonism ([place])   . Seizures ([place])    prior to diagnosed /w brain tumor, on lamictal since    . Shortness of breath    /w exertion     Patient Active Problem List   Diagnosis Date Noted  . Left ankle injury, subsequent encounter 01/24/2017  . Restless leg syndrome 06/28/2013  . MRI of brain abnormal 12/26/2012  . Complex partial seizures ([place]) 12/26/2012  . Memory loss 12/26/2012  . Esophageal reflux disease 11/22/2010  . Osteoporosis 06/14/2009  . Depressive  disorder, not elsewhere classified 06/14/2009  . Acquired hypothyroidism 06/14/2009    Past Surgical History:  Procedure Laterality Date  . ABDOMINAL HYSTERECTOMY  1990s  . BRAIN BIOPSY  2010   L temple  . BREAST BIOPSY Right 12/12/2012   Procedure: Right breast wire guided excision;  Surgeon: Rolm Bookbinder, MD;  Location: [facility];  Service: General;  Laterality: Right;  . BREAST EXCISIONAL BIOPSY Right    No scars seen   . BUNIONECTOMY     left foot  . EYE SURGERY    . KNEE SURGERY Left 2006     OB History   No obstetric history on file.      Home Medications    Prior to Admission medications   Medication Sig Start Date End Date Taking? Authorizing Provider  alendronate (FOSAMAX) 70 MG tablet Take 70 mg by mouth once a week. Sundays 10/02/17   [provider]  amLODipine (NORVASC) 2.5 MG tablet Take 2.5 mg by mouth daily. 04/25/15   [provider]  Calcium Carbonate-Vitamin D (CALCIUM 600 + D PO) Take 1 tablet by mouth daily.     [provider]  HYDROcodone-acetaminophen (NORCO/VICODIN) 5-325 MG tablet Take 1 tablet by mouth every 6 (six) hours as needed for severe pain.  10/29/17   Antonietta Breach, PA-C  lamoTRIgine (LAMICTAL) 100 MG tablet Take 1 tablet (100 mg total) by mouth daily. 07/12/17   Penumalli, Earlean Polka, MD  LORazepam (ATIVAN) 1 MG tablet Take 1 mg by mouth every 8 (eight) hours as needed for sleep (tremors).     [provider]  metoprolol succinate (TOPROL-XL) 50 MG 24 hr tablet Take 50 mg by mouth daily. 04/25/15   [provider]  Naproxen Sodium (ALEVE PO) Take 220 mg by mouth as needed (pain).     [provider]  omeprazole (PRILOSEC) 20 MG capsule Take 20 mg by mouth daily. 03/03/15   [provider]  oxyCODONE-acetaminophen (PERCOCET/ROXICET) 5-325 MG tablet Take 1-2 tablets by mouth every 6 (six) hours as needed for severe pain. 01/22/18   Petrucelli, Samantha R, PA-C  rOPINIRole (REQUIP) 1 MG tablet  Take 1 tablet (1 mg total) by mouth 2 (two) times daily. 07/12/17   Penumalli, Earlean Polka, MD  venlafaxine XR (EFFEXOR-XR) 75 MG 24 hr capsule Take 75 mg by mouth daily. 01/08/17   [provider]    Family History Family History  Problem Relation Age of Onset  . Heart disease Mother   . Heart attack Mother   . Cancer Daughter        breast mets to lung  . Breast cancer Daughter   . Diabetes Son   . Colon cancer Father     Social History Social History   Tobacco Use  . Smoking status: Never Smoker  . Smokeless tobacco: Never Used  Substance Use Topics  . Alcohol use: No  . Drug use: No     Allergies   Patient has no known allergies.   Review of Systems Review of Systems  Constitutional: Negative for fever.  Cardiovascular: Positive for chest pain.  Musculoskeletal: Positive for gait problem.  Neurological: Negative for dizziness, syncope, numbness and headaches.     Physical Exam Updated Vital Signs BP (!) 159/100 (BP Location: Right Arm)   Pulse 93   Temp (!) 97.5 F (36.4 C) (Oral)   Resp 20   Ht 5\' 3"  (1.6 m)   Wt 72.6 kg   SpO2 93%   BMI 28.34 kg/m   Physical Exam Vitals signs and nursing note reviewed.  Constitutional:      General: She is not in acute distress.    Appearance: Normal appearance. She is well-developed.     Comments: Calm and cooperative  HENT:     Head: Normocephalic and atraumatic.  Eyes:     General: No scleral icterus.       Right eye: No discharge.        Left eye: No discharge.     Conjunctiva/sclera: Conjunctivae normal.     Pupils: Pupils are equal, round, and reactive to light.  Neck:     Musculoskeletal: Normal range of motion.  Cardiovascular:     Rate and Rhythm: Normal rate and regular rhythm.  Pulmonary:     Effort: Pulmonary effort is normal. No respiratory distress.     Breath sounds: Normal breath sounds.  Chest:     Chest wall: Tenderness (Left lateral chest tenderness with bruising over the ribs.)  present.  Abdominal:     General: There is no distension.     Palpations: Abdomen is soft.     Tenderness: There is no abdominal tenderness.     Comments: No bruising or swelling.  Musculoskeletal:     Comments: Large area of  ecchymosis over the right lower back.  This area is nontender.  Patient is able to stand and walk.  Skin:    General: Skin is warm and dry.  Neurological:     Mental Status: She is alert and oriented to person, place, and time.  Psychiatric:        Behavior: Behavior normal.      ED Treatments / Results  Labs (all labs ordered are listed, but only abnormal results are displayed) Labs Reviewed - No data to display  EKG None  Radiology Dg Ribs Unilateral W/chest Left  Result Date: 05/17/2018 CLINICAL DATA:  Acute LEFT chest and rib pain following fall 3 days ago. Initial encounter. EXAM: LEFT RIBS AND CHEST - 3+ VIEW COMPARISON:  None. FINDINGS: Nondisplaced fractures of the LEFT 8th and 9th ribs noted. Cardiomegaly and peribronchial thickening again noted. There is no evidence of focal airspace disease, pulmonary edema, suspicious pulmonary nodule/mass, pleural effusion, or pneumothorax. No acute bony abnormalities are identified. IMPRESSION: LEFT 8th and 9th rib fractures. No pleural effusion or pneumothorax. Cardiomegaly and chronic peribronchial thickening. Electronically Signed   By: Margarette Canada M.D.   On: 05/17/2018 18:12    Procedures Procedures (including critical care time)  Medications Ordered in ED Medications - No data to display   Initial Impression / Assessment and Plan / ED Course  I have reviewed the triage vital signs and the nursing notes.  Pertinent labs & imaging results that were available during my care of the patient were reviewed by me and considered in my medical decision making (see chart for details).  85 year old female presents with a fall and left-sided rib pain.  She is hypertensive but otherwise vital signs are normal.   She has been ambulatory after the fall and is been over 2 days.  She reports that she did hit her head.  She denies any significant headache, dizziness.  She is not on blood thinners.  Symptoms been 2 days and she is essentially asymptomatic we will defer head CT at this time.  She also has bruising over the right lower buttock.  She has also nontender in this area and has been ambulatory.  We will defer any imaging of this as well.  X-ray of the left rib cage was ordered. Pt was discussed with Dr. Wilson Singer  X-rays show acute nondisplaced 8th and 9th rib fractures.  Discussed with the patient and her son who is now at bedside.  He states that he is concerned because his mother refuses to use her walker or cane at home.  Patient was encouraged to use assistive devices as needed and we will give her an incentive spirometer and Percocet for pain as needed.  She was advised to return if worsening.  Final Clinical Impressions(s) / ED Diagnoses   Final diagnoses:  Fall, initial encounter  Closed fracture of multiple ribs of left side, initial encounter    ED Discharge Orders    None       Recardo Evangelist, PA-C 05/17/18 1947    Virgel Manifold, MD 05/17/18 [phone]

## 2018-05-17 NOTE — Discharge Instructions (Signed)
Use incentive spirometer several times a day to help prevent pneumonia Take Percocet as needed for severe pain Use your walker or cane when walking to prevent future falls Return if worsening

## 2018-07-14 ENCOUNTER — Telehealth: Payer: Self-pay | Admitting: *Deleted

## 2018-07-14 NOTE — Telephone Encounter (Signed)
Called patient and informed her that due to current COVID 19 pandemic, our office is severely reducing in person visits in order to minimize the risk to our patients and healthcare providers. We recommend to convert her appointment to a video/telephone visit. Her son's significant other, Jeani Hawking got on the line as well. She stated the patient doesn't have means for video, and she stated she is "doing fine". Patient lives with her son and Jeani Hawking. She has one refill on ropinirole and lamotrigine. Jeani Hawking denied the patient having any seizure activity and stated she is stable. Jeani Hawking asked that the appointment be cancelled, and she will call back after COVID 19 to reschedule. I advised she call pharmacy and ask for refills on the medications. Jeani Hawking and patient verbalized understanding, appreciation.

## 2018-07-18 ENCOUNTER — Ambulatory Visit: Payer: Medicare Other | Admitting: Diagnostic Neuroimaging

## 2018-09-17 ENCOUNTER — Other Ambulatory Visit: Payer: Self-pay | Admitting: Diagnostic Neuroimaging

## 2018-09-19 NOTE — Telephone Encounter (Signed)
Refilled x 3 months with note to pharmacy to have her call and schedule follow up.

## 2018-12-24 ENCOUNTER — Other Ambulatory Visit: Payer: Self-pay | Admitting: Diagnostic Neuroimaging

## 2019-06-30 ENCOUNTER — Encounter (HOSPITAL_BASED_OUTPATIENT_CLINIC_OR_DEPARTMENT_OTHER): Payer: Self-pay

## 2019-06-30 ENCOUNTER — Emergency Department (HOSPITAL_BASED_OUTPATIENT_CLINIC_OR_DEPARTMENT_OTHER): Payer: Medicare Other

## 2019-06-30 ENCOUNTER — Other Ambulatory Visit: Payer: Self-pay

## 2019-06-30 ENCOUNTER — Emergency Department (HOSPITAL_BASED_OUTPATIENT_CLINIC_OR_DEPARTMENT_OTHER)
Admission: EM | Admit: 2019-06-30 | Discharge: 2019-06-30 | Disposition: A | Discharge status: Home / Self Care | Payer: Medicare Other | Attending: Emergency Medicine | Admitting: Emergency Medicine

## 2019-06-30 DIAGNOSIS — S3992XA Unspecified injury of lower back, initial encounter: Secondary | ICD-10-CM | POA: Diagnosis not present

## 2019-06-30 DIAGNOSIS — G20 Parkinson's disease: Secondary | ICD-10-CM | POA: Diagnosis not present

## 2019-06-30 DIAGNOSIS — Y929 Unspecified place or not applicable: Secondary | ICD-10-CM | POA: Insufficient documentation

## 2019-06-30 DIAGNOSIS — M545 Low back pain: Secondary | ICD-10-CM | POA: Diagnosis not present

## 2019-06-30 DIAGNOSIS — Y9389 Activity, other specified: Secondary | ICD-10-CM | POA: Diagnosis not present

## 2019-06-30 DIAGNOSIS — S51811A Laceration without foreign body of right forearm, initial encounter: Secondary | ICD-10-CM | POA: Diagnosis not present

## 2019-06-30 DIAGNOSIS — S8992XA Unspecified injury of left lower leg, initial encounter: Secondary | ICD-10-CM | POA: Insufficient documentation

## 2019-06-30 DIAGNOSIS — M25562 Pain in left knee: Secondary | ICD-10-CM | POA: Insufficient documentation

## 2019-06-30 DIAGNOSIS — W010XXA Fall on same level from slipping, tripping and stumbling without subsequent striking against object, initial encounter: Secondary | ICD-10-CM | POA: Diagnosis not present

## 2019-06-30 DIAGNOSIS — S0990XA Unspecified injury of head, initial encounter: Secondary | ICD-10-CM | POA: Diagnosis present

## 2019-06-30 DIAGNOSIS — Y999 Unspecified external cause status: Secondary | ICD-10-CM | POA: Insufficient documentation

## 2019-06-30 DIAGNOSIS — S0083XA Contusion of other part of head, initial encounter: Secondary | ICD-10-CM | POA: Insufficient documentation

## 2019-06-30 DIAGNOSIS — W19XXXA Unspecified fall, initial encounter: Secondary | ICD-10-CM

## 2019-06-30 DIAGNOSIS — Z23 Encounter for immunization: Secondary | ICD-10-CM | POA: Diagnosis not present

## 2019-06-30 DIAGNOSIS — S60211A Contusion of right wrist, initial encounter: Secondary | ICD-10-CM | POA: Insufficient documentation

## 2019-06-30 DIAGNOSIS — S6991XA Unspecified injury of right wrist, hand and finger(s), initial encounter: Secondary | ICD-10-CM | POA: Diagnosis not present

## 2019-06-30 MED ORDER — TETANUS-DIPHTH-ACELL PERTUSSIS 5-2.5-18.5 LF-MCG/0.5 IM SUSP
0.5000 mL | Freq: Once | INTRAMUSCULAR | Status: AC
  Administered 2019-06-30: 0.5 mL via INTRAMUSCULAR
  Filled 2019-06-30: qty 0.5

## 2019-06-30 MED ORDER — ACETAMINOPHEN 325 MG PO TABS
ORAL_TABLET | ORAL | Status: AC
  Filled 2019-06-30: qty 2

## 2019-06-30 MED ORDER — ACETAMINOPHEN 325 MG PO TABS
650.0000 mg | ORAL_TABLET | Freq: Once | ORAL | Status: AC
  Administered 2019-06-30: 650 mg via ORAL

## 2019-06-30 MED ORDER — ACETAMINOPHEN 160 MG/5ML PO SOLN: 15.0000 mg/kg | Freq: Once | ORAL | Status: DC

## 2019-06-30 NOTE — Discharge Instructions (Signed)
Your evaluation today is reassuring, imaging did not show any acute injuries from your fall today.  It does show some changes in the knee that could be causing her intermittent swelling and pain, please follow-up with orthopedics regarding this, you will need to call to schedule an appointment.  Apply ice and heat to forehead hematoma to help with swelling and discomfort, you can use Tylenol for pain.  Use knee immobilizer when up walking to help with left knee discomfort.  Skin tear on the right arm was repaired with Steri-Strips, allow these to fall off on their own they should help keep it together so it can heal, monitor for any signs of infection.

## 2019-06-30 NOTE — ED Provider Notes (Signed)
[facility] EMERGENCY DEPARTMENT Provider Note   CSN: [id number] Arrival date & time: 06/30/19  1430     History Chief Complaint  Patient presents with  . Fall    Carrie Short is a 86 y.o. female.  Carrie Short is a 86 y.o. female with a history of Parkinson's, dementia, hypertension, hyperlipidemia, headaches, who presents to the ED accompanied by her son for evaluation after mechanical fall.  Patient states that around 11 AM this morning she got tripped up when walking with her walker and fell forwards striking her forehead on the ground.  She did not lose consciousness, reports mild headache surrounding the hematoma and abrasion on her forehead.  She also reports some bruising to her right wrist and that she had a previous skin tear from another recent fall but open back up today.  She also complains of some pain at her left knee and in her low back.  She denies any neck pain at all, denies any numbness weakness or tingling in her lower extremities.  Did not hit her ribs and denies any pain to the chest or abdomen.  Her son at bedside states that she is at her baseline mental status.  Unsure when her last tetanus vaccine was.  No other aggravating or alleviating factors. Pt is not on blood thinners.        Past Medical History:  Diagnosis Date  . Anxiety   . Arthritis    L arm, hips & knees - recently had injection in R  knee - Dr. Lorene Dy   . Brain tumor (benign) ([place]) 2010   has consulted /w Dr. Tommi Rumps at [facility], AUG 2013- had MRI  . Cataract   . Dysrhythmia    "my heart skips some beats"  . GERD (gastroesophageal reflux disease)   . H/O hiatal hernia   . Headache(784.0)    pt. believes the recent headaches are related to "brain tumor", states she has a followup appt. wuith Neurologist in Sept.    . Heart murmur   . High cholesterol   . Hypertension   . Memory loss   . Parkinsonism ([place])   . Seizures ([place])    prior to diagnosed /w brain tumor, on  lamictal since    . Shortness of breath    /w exertion     Patient Active Problem List   Diagnosis Date Noted  . Left ankle injury, subsequent encounter 01/24/2017  . Restless leg syndrome 06/28/2013  . MRI of brain abnormal 12/26/2012  . Complex partial seizures ([place]) 12/26/2012  . Memory loss 12/26/2012  . Esophageal reflux disease 11/22/2010  . Osteoporosis 06/14/2009  . Depressive disorder, not elsewhere classified 06/14/2009  . Acquired hypothyroidism 06/14/2009    Past Surgical History:  Procedure Laterality Date  . ABDOMINAL HYSTERECTOMY  1990s  . BRAIN BIOPSY  2010   L temple  . BREAST BIOPSY Right 12/12/2012   Procedure: Right breast wire guided excision;  Surgeon: Rolm Bookbinder, MD;  Location: [facility];  Service: General;  Laterality: Right;  . BREAST EXCISIONAL BIOPSY Right    No scars seen   . BUNIONECTOMY     left foot  . EYE SURGERY    . KNEE SURGERY Left 2006     OB History   No obstetric history on file.     Family History  Problem Relation Age of Onset  . Heart disease Mother   . Heart attack Mother   . Cancer Daughter  breast mets to lung  . Breast cancer Daughter   . Diabetes Son   . Colon cancer Father     Social History   Tobacco Use  . Smoking status: Never Smoker  . Smokeless tobacco: Never Used  Substance Use Topics  . Alcohol use: No  . Drug use: No    Home Medications Prior to Admission medications   Medication Sig Start Date End Date Taking? Authorizing Provider  alendronate (FOSAMAX) 70 MG tablet Take 70 mg by mouth once a week. Sundays 10/02/17   [provider]  amLODipine (NORVASC) 2.5 MG tablet Take 2.5 mg by mouth daily. 04/25/15   [provider]  Calcium Carbonate-Vitamin D (CALCIUM 600 + D PO) Take 1 tablet by mouth daily.     [provider]  lamoTRIgine (LAMICTAL) 100 MG tablet TAKE 1 TABLET BY MOUTH EVERY DAY 09/19/18   Penumalli, Earlean Polka, MD  LORazepam (ATIVAN) 1 MG tablet Take 1 mg  by mouth every 8 (eight) hours as needed for sleep (tremors).     [provider]  metoprolol succinate (TOPROL-XL) 50 MG 24 hr tablet Take 50 mg by mouth daily. 04/25/15   [provider]  Naproxen Sodium (ALEVE PO) Take 220 mg by mouth as needed (pain).     [provider]  omeprazole (PRILOSEC) 20 MG capsule Take 20 mg by mouth daily. 03/03/15   [provider]  oxyCODONE-acetaminophen (PERCOCET/ROXICET) 5-325 MG tablet Take 1 tablet by mouth every 4 (four) hours as needed for severe pain. 05/17/18   Recardo Evangelist, PA-C  rOPINIRole (REQUIP) 1 MG tablet Take 1 tablet (1 mg total) by mouth 2 (two) times daily. 07/12/17   Penumalli, Earlean Polka, MD  venlafaxine XR (EFFEXOR-XR) 75 MG 24 hr capsule Take 75 mg by mouth daily. 01/08/17   [provider]    Allergies    Patient has no known allergies.  Review of Systems   Review of Systems  Constitutional: Negative for chills and fever.  HENT: Negative.   Eyes: Negative for visual disturbance.  Cardiovascular: Negative for chest pain.  Gastrointestinal: Negative for abdominal pain, nausea and vomiting.  Genitourinary: Negative for dysuria and frequency.  Musculoskeletal: Negative for arthralgias and myalgias.  Skin: Positive for color change (Bruising) and wound.  Neurological: Positive for headaches. Negative for dizziness, weakness, light-headedness and numbness.    Physical Exam Updated Vital Signs BP (!) 152/98 (BP Location: Right Arm)   Pulse 96   Temp 98 F (36.7 C) (Oral)   Resp 18   SpO2 98%   Physical Exam Vitals and nursing note reviewed.  Constitutional:      General: She is not in acute distress.    Appearance: Normal appearance. She is well-developed and normal weight. She is not diaphoretic.  HENT:     Head: Normocephalic.     Comments: Large hematoma with overlying abrasion over the left forehead, no active bleeding, no appreciable step-off, no other scalp hematomas or  deformity noted, negative battle sign    Mouth/Throat:     Mouth: Mucous membranes are moist.     Pharynx: Oropharynx is clear.  Eyes:     General:        Right eye: No discharge.        Left eye: No discharge.     Extraocular Movements: Extraocular movements intact.     Pupils: Pupils are equal, round, and reactive to light.  Neck:     Comments: No midline C-spine  tenderness, full range of motion, no ecchymosis or deformity Cardiovascular:     Rate and Rhythm: Normal rate and regular rhythm.     Heart sounds: Normal heart sounds. No murmur. No friction rub. No gallop.   Pulmonary:     Effort: Pulmonary effort is normal. No respiratory distress.     Breath sounds: Normal breath sounds. No wheezing or rales.     Comments: Respirations equal and unlabored, patient able to speak in full sentences, lungs clear to auscultation bilaterally, chest wall NTTP Chest:     Chest wall: No tenderness.  Abdominal:     General: Bowel sounds are normal. There is no distension.     Palpations: Abdomen is soft. There is no mass.     Tenderness: There is no abdominal tenderness. There is no guarding.  Musculoskeletal:        General: No deformity.     Cervical back: Neck supple.     Comments: Right wrist with bruising over the palmar aspect, minimal tenderness and patient is able to range without difficulty.  2+ radial pulse.  Just proximal to the wrist there is a 3 cm V-shaped skin tear that was pulled back open during fall today, no active bleeding, no surrounding erythema or signs of infection. Patient does have some tenderness over the left knee with range of motion, no significant swelling noted. 2 well-healing skin tears noted on the shin distal to the knee without surrounding erythema or signs of infection. No midline thoracic tenderness, there is some mild tenderness over the midline lumbar spine without deformity. All other joints are supple and easily movable, all compartments soft.   Skin:     General: Skin is warm and dry.     Capillary Refill: Capillary refill takes less than 2 seconds.  Neurological:     Mental Status: She is alert and oriented to person, place, and time.     Coordination: Coordination normal.     Comments: Speech is clear, able to follow commands CN III-XII intact Normal strength in upper and lower extremities bilaterally including dorsiflexion and plantar flexion, strong and equal grip strength Sensation normal to light and sharp touch Moves extremities without ataxia, coordination intact  Psychiatric:        Mood and Affect: Mood normal.        Behavior: Behavior normal.     ED Results / Procedures / Treatments   Labs (all labs ordered are listed, but only abnormal results are displayed) Labs Reviewed - No data to display  EKG None  Radiology DG Lumbar Spine Complete  Result Date: 06/30/2019 CLINICAL DATA:  Fall today. EXAM: LUMBAR SPINE - COMPLETE 4+ VIEW COMPARISON:  November 30, 2018 FINDINGS: No acute lumbar fracture, malalignment, or disc space interruption. Similar chronic grade 1 anterolisthesis of L4 on L5 and grade 1 retrolisthesis of L5 on S1 compared to August 2020. Normal lumbar vertebral body heights. Diffuse mild disc space narrowing and advanced L3-S1 facet arthropathy. Abdominal aorta calcified atherosclerosis. Diffuse bone demineralization. Chronic bibasilar pulmonary disease. IMPRESSION: No acute injury of the lumbar spine. Stable chronic grade 1 spondylolisthesis compared to August 2020. Diffuse bone demineralization and diffuse mild to severe degenerative changes most pronounced at the lower facets. Electronically Signed   By: Revonda Humphrey   On: 06/30/2019 16:49   DG Wrist Complete Right  Result Date: 06/30/2019 CLINICAL DATA:  Fall EXAM: RIGHT WRIST - COMPLETE 3+ VIEW COMPARISON:  2019 FINDINGS: Decreased osseous mineralization. Alignment is anatomic. No acute  fracture. Degenerative changes at the first carpometacarpal joint.  IMPRESSION: No acute fracture Electronically Signed   By: Macy Mis M.D.   On: 06/30/2019 16:12   CT Head Wo Contrast  Result Date: 06/30/2019 CLINICAL DATA:  Head trauma from falling on the kitchen floor. Forehead contusion. History of Parkinson's disease. EXAM: CT HEAD WITHOUT CONTRAST TECHNIQUE: Contiguous axial images were obtained from the base of the skull through the vertex without intravenous contrast. COMPARISON:  January 22, 2018 FINDINGS: Brain: No evidence of acute infarction, hemorrhage, hydrocephalus, extra-axial collection or mass lesion/mass effect. Mild diffuse cerebral atrophy and proportionate mild ventriculomegaly. There is no vascular territory distribution encephalomalacia. Vascular: Benign right basal ganglia calcification. Calcified atherosclerosis of the bilateral carotid siphons. No dense vessel sign. Skull: Intact nasal bridge and skull. Diffuse bone demineralization. Small subgaleal hematoma and contusion in the forehead soft tissues without a radiopaque foreign body. Sinuses/Orbits: Normal bilateral orbital soft tissues post bilateral cataract repair. Mild bilateral ethmoidal mucoperiosteal thickening, more pronounced on the right. No posttraumatic air-fluid levels. Other: Normal aeration of the bilateral mastoid air cells and middle ears. IMPRESSION: 1. No CT evidence of acute intracranial abnormality. Intact nasal bridge and skull. 2. Small subgaleal hematoma and contusion of the forehead. 3. Mild bilateral diffuse cerebral atrophy. Electronically Signed   By: Revonda Humphrey   On: 06/30/2019 16:08   DG Knee Complete 4 Views Left  Result Date: 06/30/2019 CLINICAL DATA:  Fall. Right knee bruising. Parkinson's. EXAM: LEFT KNEE - COMPLETE 4+ VIEW COMPARISON:  None. FINDINGS: No acute fracture lucency or dislocation of the left knee. Large suprapatellar left knee joint effusion with mild edema in Hoffa's fat pad. A chronic well-corticated cortical avulsion, probably from the  distal femoral metadiaphysis nonarticular surface measures 13 x 25 mm. There is a fabella. A probable loose intra-articular body measuring 6 x 3 mm. Subtle chondral calcification in the tibiofemoral compartments. Tricompartmental mild osteoarthropathic joint space narrowing. Prominent soft tissue edema and thickening in the prepatellar and pretibial soft tissues, and a 6 x 2 cm ovoid soft tissue mass in the medial knee concerning for hematoma and adjacent musculotendinous or ligamentous injury. These findings are superimposed on a mild diffuse subcutaneous soft tissue edema that extends into the thigh and calf. There is diffuse bone demineralization. IMPRESSION: 1. No acute bony abnormality. 2. Large suprapatellar left knee joint effusion. 3. Prominent prepatellar and pretibial soft tissue edema, and a 6 x 2 cm ovoid soft tissue mass in the medial knee concerning for hematoma with adjacent musculotendinous or ligamentous injury. An acute medial knee instability could be present. 4. Chronic cortical avulsion from the distal posterior femur nonarticular surface. 5. Tricompartmental mild osteoarthropathy. 6. Diffuse bone demineralization. Electronically Signed   By: Revonda Humphrey   On: 06/30/2019 16:22    Procedures .Marland KitchenLaceration Repair  Date/Time: 06/30/2019 5:20 PM Performed by: Jacqlyn Larsen, PA-C Authorized by: Jacqlyn Larsen, PA-C   Consent:    Consent obtained:  Verbal   Consent given by:  Patient   Risks discussed:  Infection, pain, poor wound healing and poor cosmetic result   Alternatives discussed:  No treatment Anesthesia (see MAR for exact dosages):    Anesthesia method:  None Laceration details:    Location:  Shoulder/arm   Shoulder/arm location:  R lower arm   Length (cm):  3 (skin tear)   Depth (mm):  1 Repair type:    Repair type:  Simple Exploration:    Hemostasis achieved with:  Direct pressure  Wound extent: areolar tissue violated   Treatment:    Area cleansed with:   Shur-Clens   Amount of cleaning:  Standard Skin repair:    Repair method:  Steri-Strips   Number of Steri-Strips:  2 Approximation:    Approximation:  Close Post-procedure details:    Dressing:  Tube gauze   Patient tolerance of procedure:  Tolerated well, no immediate complications   (including critical care time)  Medications Ordered in ED Medications  acetaminophen (TYLENOL) 325 MG tablet (has no administration in time range)  Tdap (BOOSTRIX) injection 0.5 mL (0.5 mLs Intramuscular Given 06/30/19 1513)  acetaminophen (TYLENOL) tablet 650 mg (650 mg Oral Given 06/30/19 1515)    ED Course  I have reviewed the triage vital signs and the nursing notes.  Pertinent labs & imaging results that were available during my care of the patient were reviewed by me and considered in my medical decision making (see chart for details).  Clinical Course as of Jun 30 1603  Fri Jun 30, 2019  1556 Patient seen by myself as well as PA provider.  Briefly this is an 86 year old female with a history of dementia and parkinsons who presents emergency department after mechanical fall at home today.  She presents with ecchymoses over the right wrist without significant tenderness on palpation full range of motion at the wrist.  She also has a superficial abrasion to her left forehead with surrounding ecchymosis, no significant hematoma.  She complains of mild headache as well as pain in her right wrist.  Imaging is pending.  If negative anticipate can discharge her home.  She lives with her son who is present at bedside.   [MT]    Clinical Course User Index [MT] Wyvonnia Dusky, MD   MDM Rules/Calculators/A&P                     86 year old female presents accompanied by her son after a mechanical fall, history of many falls related to her Parkinson's disease.  She fell forward striking her forehead and has a large hematoma with an overlying abrasion, no active bleeding or laceration requiring repair.  She  has no midline cervical or thoracic tenderness had some mild lumbar tenderness, also noted to have bruising over the right wrist and some pain with range of motion of the left knee.  CT of the head as well as x-rays of the right wrist, left knee and lumbar spine ordered.  There is a small skin tear over the right forearm that was reopened during the fall today, this will be closed with Steri-Strips.  Tetanus updated.  Imaging today is reassuring and does not show acute injuries from fall, knee films show a large suprapatellar  effusion and prominent prepatellar and pretibial soft tissue edema, there is a 6 x 2 cm ovoid soft tissue mass that could be hematoma related to ligamentous injury, there is also a chronic cortical avulsion from the distal posterior femur and tricompartmental osteoarthritis noted.  The patient son reports that over the past several weeks she has been having intermittent swelling and discomfort.  Will apply knee immobilizer, patient has been able to ambulate at her baseline with walker here in the department.  We will have her follow-up with orthopedics for further evaluation of this.   At this time there does not appear to be any evidence of an acute emergency medical condition and the patient appears stable for discharge with appropriate outpatient follow up.Diagnosis was discussed with patient  who verbalizes understanding and is agreeable to discharge. Pt case discussed with Dr. Langston Masker who agrees with my plan.    Final Clinical Impression(s) / ED Diagnoses Final diagnoses:  Fall, initial encounter  Traumatic hematoma of forehead, initial encounter  Skin tear of right forearm without complication, initial encounter  Acute pain of left knee    Rx / DC Orders ED Discharge Orders    None       Janet Berlin 06/30/19 1724    Wyvonnia Dusky, MD 06/30/19 [phone]

## 2019-06-30 NOTE — ED Triage Notes (Addendum)
Pt states she fell over her walker ~11am-bruising/abrasoins to left forehead-no LOC-also skin tear to right FA-pt states not new but she knocked the scab-bruising noted to inner wrist-son states pt with hx multiple falls-NAD-to triage in w/c

## 2019-06-30 NOTE — ED Notes (Signed)
Ambulated pt. With walker in hallway, pt. Walked at baseline per grandson, felt good with knee brace on.

## 2019-08-20 ENCOUNTER — Other Ambulatory Visit: Payer: Self-pay

## 2019-08-20 ENCOUNTER — Emergency Department (HOSPITAL_BASED_OUTPATIENT_CLINIC_OR_DEPARTMENT_OTHER)
Admission: EM | Admit: 2019-08-20 | Discharge: 2019-08-20 | Disposition: A | Discharge status: Home / Self Care | Payer: Medicare Other | Attending: Emergency Medicine | Admitting: Emergency Medicine

## 2019-08-20 ENCOUNTER — Emergency Department (HOSPITAL_BASED_OUTPATIENT_CLINIC_OR_DEPARTMENT_OTHER): Payer: Medicare Other

## 2019-08-20 ENCOUNTER — Encounter (HOSPITAL_BASED_OUTPATIENT_CLINIC_OR_DEPARTMENT_OTHER): Payer: Self-pay | Admitting: *Deleted

## 2019-08-20 DIAGNOSIS — D332 Benign neoplasm of brain, unspecified: Secondary | ICD-10-CM | POA: Diagnosis not present

## 2019-08-20 DIAGNOSIS — E039 Hypothyroidism, unspecified: Secondary | ICD-10-CM | POA: Insufficient documentation

## 2019-08-20 DIAGNOSIS — M25561 Pain in right knee: Secondary | ICD-10-CM

## 2019-08-20 DIAGNOSIS — M1711 Unilateral primary osteoarthritis, right knee: Secondary | ICD-10-CM | POA: Insufficient documentation

## 2019-08-20 DIAGNOSIS — I10 Essential (primary) hypertension: Secondary | ICD-10-CM | POA: Diagnosis not present

## 2019-08-20 DIAGNOSIS — G20 Parkinson's disease: Secondary | ICD-10-CM | POA: Insufficient documentation

## 2019-08-20 DIAGNOSIS — Z79899 Other long term (current) drug therapy: Secondary | ICD-10-CM | POA: Diagnosis not present

## 2019-08-20 MED ORDER — TRAMADOL HCL 50 MG PO TABS: 50.0000 mg | ORAL_TABLET | Freq: Four times a day (QID) | ORAL | 0 refills | Status: AC | PRN

## 2019-08-20 MED ORDER — TRAMADOL HCL 50 MG PO TABS
50.0000 mg | ORAL_TABLET | Freq: Once | ORAL | Status: AC
  Administered 2019-08-20: 50 mg via ORAL
  Filled 2019-08-20: qty 1

## 2019-08-20 MED ORDER — PREDNISONE 20 MG PO TABS
20.0000 mg | ORAL_TABLET | Freq: Once | ORAL | Status: AC
  Administered 2019-08-20: 20 mg via ORAL
  Filled 2019-08-20: qty 1

## 2019-08-20 MED ORDER — PREDNISONE 10 MG PO TABS: 20.0000 mg | ORAL_TABLET | Freq: Two times a day (BID) | ORAL | 0 refills | Status: AC

## 2019-08-20 NOTE — ED Notes (Signed)
Pharmacy and medications updated with patient 

## 2019-08-20 NOTE — ED Provider Notes (Signed)
[facility] Provider Note   CSN: [id number] Arrival date & time: 08/20/19  2046     History Chief Complaint  Patient presents with  . Knee Pain    Carrie Short is a 86 y.o. female.  Patient is an 86 year old female with history of osteoarthritis, GERD, hiatal hernia, hypertension, Parkinson's.  She presents today for evaluation of right knee pain.  This has been worsening over the past 3 days and began in the absence of any injury or trauma.  She denies any fevers or chills.  Pain is worse with ambulation making it difficult to walk.  The history is provided by the patient.  Knee Pain Location:  Knee Time since incident:  3 days Injury: no   Knee location:  R knee Pain details:    Quality:  Aching   Radiates to:  Does not radiate   Severity:  Moderate   Onset quality:  Sudden   Timing:  Constant   Progression:  Worsening Chronicity:  New Relieved by:  Nothing Worsened by:  Bearing weight and activity Ineffective treatments:  None tried      Past Medical History:  Diagnosis Date  . Anxiety   . Arthritis    L arm, hips & knees - recently had injection in R  knee - Dr. Lorene Dy   . Brain tumor (benign) ([place]) 2010   has consulted /w Dr. Tommi Rumps at [facility], AUG 2013- had MRI  . Cataract   . Dysrhythmia    "my heart skips some beats"  . GERD (gastroesophageal reflux disease)   . H/O hiatal hernia   . Headache(784.0)    pt. believes the recent headaches are related to "brain tumor", states she has a followup appt. wuith Neurologist in Sept.    . Heart murmur   . High cholesterol   . Hypertension   . Memory loss   . Parkinsonism ([place])   . Seizures ([place])    prior to diagnosed /w brain tumor, on lamictal since    . Shortness of breath    /w exertion     Patient Active Problem List   Diagnosis Date Noted  . Left ankle injury, subsequent encounter 01/24/2017  . Restless leg syndrome 06/28/2013  . MRI of brain abnormal  12/26/2012  . Complex partial seizures ([place]) 12/26/2012  . Memory loss 12/26/2012  . Esophageal reflux disease 11/22/2010  . Osteoporosis 06/14/2009  . Depressive disorder, not elsewhere classified 06/14/2009  . Acquired hypothyroidism 06/14/2009    Past Surgical History:  Procedure Laterality Date  . ABDOMINAL HYSTERECTOMY  1990s  . BRAIN BIOPSY  2010   L temple  . BREAST BIOPSY Right 12/12/2012   Procedure: Right breast wire guided excision;  Surgeon: Rolm Bookbinder, MD;  Location: [place];  Service: General;  Laterality: Right;  . BREAST EXCISIONAL BIOPSY Right    No scars seen   . BUNIONECTOMY     left foot  . EYE SURGERY    . KNEE SURGERY Left 2006     OB History   No obstetric history on file.     Family History  Problem Relation Age of Onset  . Heart disease Mother   . Heart attack Mother   . Cancer Daughter        breast mets to lung  . Breast cancer Daughter   . Diabetes Son   . Colon cancer Father     Social History   Tobacco Use  . Smoking status:  Never Smoker  . Smokeless tobacco: Never Used  Substance Use Topics  . Alcohol use: No  . Drug use: No    Home Medications Prior to Admission medications   Medication Sig Start Date End Date Taking? Authorizing Provider  alendronate (FOSAMAX) 70 MG tablet Take 70 mg by mouth once a week. Sundays 10/02/17   [provider]  amLODipine (NORVASC) 2.5 MG tablet Take 2.5 mg by mouth daily. 04/25/15   [provider]  Calcium Carbonate-Vitamin D (CALCIUM 600 + D PO) Take 1 tablet by mouth daily.     [provider]  lamoTRIgine (LAMICTAL) 100 MG tablet TAKE 1 TABLET BY MOUTH EVERY DAY 09/19/18   Penumalli, Earlean Polka, MD  LORazepam (ATIVAN) 1 MG tablet Take 1 mg by mouth every 8 (eight) hours as needed for sleep (tremors).     [provider]  metoprolol succinate (TOPROL-XL) 50 MG 24 hr tablet Take 50 mg by mouth daily. 04/25/15   [provider]  Naproxen Sodium (ALEVE PO)  Take 220 mg by mouth as needed (pain).     [provider]  omeprazole (PRILOSEC) 20 MG capsule Take 20 mg by mouth daily. 03/03/15   [provider]  oxyCODONE-acetaminophen (PERCOCET/ROXICET) 5-325 MG tablet Take 1 tablet by mouth every 4 (four) hours as needed for severe pain. 05/17/18   Recardo Evangelist, PA-C  rOPINIRole (REQUIP) 1 MG tablet Take 1 tablet (1 mg total) by mouth 2 (two) times daily. 07/12/17   Penumalli, Earlean Polka, MD  venlafaxine XR (EFFEXOR-XR) 75 MG 24 hr capsule Take 75 mg by mouth daily. 01/08/17   [provider]    Allergies    Patient has no known allergies.  Review of Systems   Review of Systems  All other systems reviewed and are negative.   Physical Exam Updated Vital Signs BP (!) 156/87 (BP Location: Right Arm)   Pulse (!) 102   Temp 98.2 F (36.8 C) (Oral)   Resp 18   Ht 5\' 5"  (1.651 m)   Wt 77.1 kg   SpO2 94%   BMI 28.29 kg/m   Physical Exam Vitals and nursing note reviewed.  Constitutional:      General: She is not in acute distress.    Appearance: Normal appearance. She is not ill-appearing.  HENT:     Head: Normocephalic and atraumatic.  Pulmonary:     Effort: Pulmonary effort is normal.  Musculoskeletal:     Comments: The right knee appears grossly normal and symmetrical with the left.  There is no palpable effusion.  She has good range of motion with minimal pain.  There is no crepitus.  There is no laxity with varus or valgus stress, and anterior/posterior drawer test is negative.  Skin:    General: Skin is warm and dry.  Neurological:     Mental Status: She is alert.     ED Results / Procedures / Treatments   Labs (all labs ordered are listed, but only abnormal results are displayed) Labs Reviewed - No data to display  EKG None  Radiology No results found.  Procedures Procedures (including critical care time)  Medications Ordered in ED Medications - No data to display  ED Course  I have  reviewed the triage vital signs and the nursing notes.  Pertinent labs & imaging results that were available during my care of the patient were reviewed by me and considered in my medical decision making (see chart for details).  MDM Rules/Calculators/A&P  Patient presenting here with complaints of right knee pain that began 3 days ago in the absence of any injury or trauma.  Patient has history of osteoarthritis and this appears to be DJD with effusion.  Her x-ray does show a small effusion which is also palpable on exam.  She has good range of motion with no crepitus.  I highly doubt a septic joint and do not feel as though arthrocentesis is indicated at this time.  Patient will be treated with a trial course of prednisone, medication for pain, and is to follow-up with an orthopedic surgeon in the next week if she is not improving.  Final Clinical Impression(s) / ED Diagnoses Final diagnoses:  None    Rx / DC Orders ED Discharge Orders    None       Veryl Speak, MD 08/20/19 2313

## 2019-08-20 NOTE — ED Triage Notes (Signed)
Pt is here with her son. He reports she has been having knee pain  "for awhile" but is now having difficulty getting around. Also c/o her right leg is draining "something clear". She reports pain in both knees, worse in the right

## 2019-08-20 NOTE — Discharge Instructions (Addendum)
Begin taking prednisone as prescribed.  Take tramadol as prescribed as needed for pain.  Follow-up with orthopedic surgery if your symptoms or not improving in the next week.  The contact information for Dr. Alvan Dame has been provided in this discharge summary for you to call and make these arrangements.  Return to the ER for worsening pain, high fever, or other new and concerning symptoms.

## 2019-08-21 ENCOUNTER — Encounter (INDEPENDENT_AMBULATORY_CARE_PROVIDER_SITE_OTHER): Payer: Medicare Other | Admitting: Ophthalmology

## 2019-09-19 ENCOUNTER — Other Ambulatory Visit: Payer: Self-pay

## 2019-09-19 ENCOUNTER — Encounter (INDEPENDENT_AMBULATORY_CARE_PROVIDER_SITE_OTHER): Payer: Medicare Other | Admitting: Ophthalmology

## 2019-09-19 DIAGNOSIS — H34832 Tributary (branch) retinal vein occlusion, left eye, with macular edema: Secondary | ICD-10-CM

## 2019-09-19 DIAGNOSIS — H35033 Hypertensive retinopathy, bilateral: Secondary | ICD-10-CM | POA: Diagnosis not present

## 2019-09-19 DIAGNOSIS — H353132 Nonexudative age-related macular degeneration, bilateral, intermediate dry stage: Secondary | ICD-10-CM

## 2019-09-19 DIAGNOSIS — I10 Essential (primary) hypertension: Secondary | ICD-10-CM | POA: Diagnosis not present

## 2019-09-19 DIAGNOSIS — H43813 Vitreous degeneration, bilateral: Secondary | ICD-10-CM

## 2019-09-25 ENCOUNTER — Other Ambulatory Visit: Payer: Self-pay

## 2019-09-25 ENCOUNTER — Encounter (INDEPENDENT_AMBULATORY_CARE_PROVIDER_SITE_OTHER): Payer: Medicare Other | Admitting: Ophthalmology

## 2019-09-25 DIAGNOSIS — H353221 Exudative age-related macular degeneration, left eye, with active choroidal neovascularization: Secondary | ICD-10-CM

## 2019-10-15 ENCOUNTER — Encounter (HOSPITAL_BASED_OUTPATIENT_CLINIC_OR_DEPARTMENT_OTHER): Payer: Self-pay | Admitting: Emergency Medicine

## 2019-10-15 ENCOUNTER — Emergency Department (HOSPITAL_BASED_OUTPATIENT_CLINIC_OR_DEPARTMENT_OTHER): Payer: Medicare Other

## 2019-10-15 ENCOUNTER — Emergency Department (HOSPITAL_BASED_OUTPATIENT_CLINIC_OR_DEPARTMENT_OTHER)
Admission: EM | Admit: 2019-10-15 | Discharge: 2019-10-15 | Disposition: A | Discharge status: Home / Self Care | Payer: Medicare Other | Attending: Emergency Medicine | Admitting: Emergency Medicine

## 2019-10-15 ENCOUNTER — Other Ambulatory Visit: Payer: Self-pay

## 2019-10-15 DIAGNOSIS — W19XXXA Unspecified fall, initial encounter: Secondary | ICD-10-CM | POA: Diagnosis not present

## 2019-10-15 DIAGNOSIS — S50311A Abrasion of right elbow, initial encounter: Secondary | ICD-10-CM | POA: Insufficient documentation

## 2019-10-15 DIAGNOSIS — Z7901 Long term (current) use of anticoagulants: Secondary | ICD-10-CM | POA: Diagnosis not present

## 2019-10-15 DIAGNOSIS — S80811A Abrasion, right lower leg, initial encounter: Secondary | ICD-10-CM | POA: Insufficient documentation

## 2019-10-15 DIAGNOSIS — Y9389 Activity, other specified: Secondary | ICD-10-CM | POA: Insufficient documentation

## 2019-10-15 DIAGNOSIS — Y999 Unspecified external cause status: Secondary | ICD-10-CM | POA: Insufficient documentation

## 2019-10-15 DIAGNOSIS — I10 Essential (primary) hypertension: Secondary | ICD-10-CM | POA: Insufficient documentation

## 2019-10-15 DIAGNOSIS — Z79899 Other long term (current) drug therapy: Secondary | ICD-10-CM | POA: Diagnosis not present

## 2019-10-15 DIAGNOSIS — S0093XA Contusion of unspecified part of head, initial encounter: Secondary | ICD-10-CM | POA: Insufficient documentation

## 2019-10-15 DIAGNOSIS — Z86011 Personal history of benign neoplasm of the brain: Secondary | ICD-10-CM | POA: Diagnosis not present

## 2019-10-15 DIAGNOSIS — E039 Hypothyroidism, unspecified: Secondary | ICD-10-CM | POA: Insufficient documentation

## 2019-10-15 DIAGNOSIS — S0990XA Unspecified injury of head, initial encounter: Secondary | ICD-10-CM | POA: Diagnosis present

## 2019-10-15 DIAGNOSIS — Y929 Unspecified place or not applicable: Secondary | ICD-10-CM | POA: Diagnosis not present

## 2019-10-15 NOTE — ED Triage Notes (Addendum)
She leaned over to pick something up and fell. Denies LOC. Large hematoma noted to R side of head. Also reports skin tear to R elbow and R leg. No blood thinners

## 2019-10-15 NOTE — ED Provider Notes (Signed)
[facility] Provider Note   CSN: [id number] Arrival date & time: 10/15/19  5009     History Chief Complaint  Patient presents with  . Fall    Carrie Short is a 86 y.o. female.  Patient with mechanical fall this morning while getting up.  Was using her walker and fell landing on the right side of her head.  Skin tear to right elbow and right lower leg but does not have any pain in these areas.  Was able to ambulate afterwards.  Patient is not on blood thinners.  Has large hematoma over the right side of her head per her husband.  The history is provided by the patient.  Fall This is a new problem. The current episode started less than 1 hour ago. The problem has been resolved. Associated symptoms include headaches. Pertinent negatives include no chest pain, no abdominal pain and no shortness of breath. Nothing aggravates the symptoms. Nothing relieves the symptoms. She has tried nothing for the symptoms. The treatment provided no relief.       Past Medical History:  Diagnosis Date  . Anxiety   . Arthritis    L arm, hips & knees - recently had injection in R  knee - Dr. Lorene Dy   . Brain tumor (benign) ([place]) 2010   has consulted /w Dr. Tommi Rumps at [facility], AUG 2013- had MRI  . Cataract   . Dysrhythmia    "my heart skips some beats"  . GERD (gastroesophageal reflux disease)   . H/O hiatal hernia   . Headache(784.0)    pt. believes the recent headaches are related to "brain tumor", states she has a followup appt. wuith Neurologist in Sept.    . Heart murmur   . High cholesterol   . Hypertension   . Memory loss   . Parkinsonism ([place])   . Seizures ([place])    prior to diagnosed /w brain tumor, on lamictal since    . Shortness of breath    /w exertion     Patient Active Problem List   Diagnosis Date Noted  . Left ankle injury, subsequent encounter 01/24/2017  . Restless leg syndrome 06/28/2013  . MRI of brain abnormal 12/26/2012  .  Complex partial seizures ([place]) 12/26/2012  . Memory loss 12/26/2012  . Esophageal reflux disease 11/22/2010  . Osteoporosis 06/14/2009  . Depressive disorder, not elsewhere classified 06/14/2009  . Acquired hypothyroidism 06/14/2009    Past Surgical History:  Procedure Laterality Date  . ABDOMINAL HYSTERECTOMY  1990s  . BRAIN BIOPSY  2010   L temple  . BREAST BIOPSY Right 12/12/2012   Procedure: Right breast wire guided excision;  Surgeon: Rolm Bookbinder, MD;  Location: [facility];  Service: General;  Laterality: Right;  . BREAST EXCISIONAL BIOPSY Right    No scars seen   . BUNIONECTOMY     left foot  . EYE SURGERY    . KNEE SURGERY Left 2006     OB History   No obstetric history on file.     Family History  Problem Relation Age of Onset  . Heart disease Mother   . Heart attack Mother   . Cancer Daughter        breast mets to lung  . Breast cancer Daughter   . Diabetes Son   . Colon cancer Father     Social History   Tobacco Use  . Smoking status: Never Smoker  . Smokeless tobacco: Never Used  Vaping  Use  . Vaping Use: Never used  Substance Use Topics  . Alcohol use: No  . Drug use: No    Home Medications Prior to Admission medications   Medication Sig Start Date End Date Taking? Authorizing Provider  alendronate (FOSAMAX) 70 MG tablet Take 70 mg by mouth once a week. Sundays 10/02/17   [provider]  amLODipine (NORVASC) 2.5 MG tablet Take 2.5 mg by mouth daily. 04/25/15   [provider]  Calcium Carbonate-Vitamin D (CALCIUM 600 + D PO) Take 1 tablet by mouth daily.     [provider]  lamoTRIgine (LAMICTAL) 100 MG tablet TAKE 1 TABLET BY MOUTH EVERY DAY 09/19/18   Penumalli, Earlean Polka, MD  LORazepam (ATIVAN) 1 MG tablet Take 1 mg by mouth every 8 (eight) hours as needed for sleep (tremors).     [provider]  metoprolol succinate (TOPROL-XL) 50 MG 24 hr tablet Take 50 mg by mouth daily. 04/25/15   [provider]    Naproxen Sodium (ALEVE PO) Take 220 mg by mouth as needed (pain).     [provider]  omeprazole (PRILOSEC) 20 MG capsule Take 20 mg by mouth daily. 03/03/15   [provider]  oxyCODONE-acetaminophen (PERCOCET/ROXICET) 5-325 MG tablet Take 1 tablet by mouth every 4 (four) hours as needed for severe pain. 05/17/18   Recardo Evangelist, PA-C  predniSONE (DELTASONE) 10 MG tablet Take 2 tablets (20 mg total) by mouth 2 (two) times daily with a meal. 08/20/19   Delo, Nathaneil Canary, MD  rOPINIRole (REQUIP) 1 MG tablet Take 1 tablet (1 mg total) by mouth 2 (two) times daily. 07/12/17   Penumalli, Earlean Polka, MD  traMADol (ULTRAM) 50 MG tablet Take 1 tablet (50 mg total) by mouth every 6 (six) hours as needed. 08/20/19   Veryl Speak, MD  venlafaxine XR (EFFEXOR-XR) 75 MG 24 hr capsule Take 75 mg by mouth daily. 01/08/17   [provider]    Allergies    Patient has no known allergies.  Review of Systems   Review of Systems  Constitutional: Negative for chills and fever.  HENT: Negative for ear pain and sore throat.   Eyes: Negative for pain and visual disturbance.  Respiratory: Negative for cough and shortness of breath.   Cardiovascular: Negative for chest pain and palpitations.  Gastrointestinal: Negative for abdominal pain and vomiting.  Genitourinary: Negative for dysuria and hematuria.  Musculoskeletal: Negative for arthralgias and back pain.  Skin: Positive for wound. Negative for color change and rash.  Neurological: Positive for headaches. Negative for seizures and syncope.  All other systems reviewed and are negative.   Physical Exam Updated Vital Signs BP (!) 153/102 (BP Location: Right Arm)   Pulse 98   Temp 97.9 F (36.6 C) (Oral)   Resp 18   Ht 5\' 4"  (1.626 m)   Wt 72.6 kg   SpO2 96%   BMI 27.46 kg/m   Physical Exam Vitals and nursing note reviewed.  Constitutional:      General: She is not in acute distress.    Appearance: She is well-developed. She  is not ill-appearing.  HENT:     Head: Normocephalic and atraumatic.  Eyes:     Extraocular Movements: Extraocular movements intact.     Conjunctiva/sclera: Conjunctivae normal.     Pupils: Pupils are equal, round, and reactive to light.  Cardiovascular:     Rate and Rhythm: Normal rate and regular rhythm.     Heart sounds: No  murmur heard.   Pulmonary:     Effort: Pulmonary effort is normal. No respiratory distress.     Breath sounds: Normal breath sounds.  Abdominal:     Palpations: Abdomen is soft.     Tenderness: There is no abdominal tenderness.  Musculoskeletal:        General: No tenderness. Normal range of motion.     Cervical back: Normal range of motion and neck supple. No tenderness.     Comments: No tenderness to extremities particularly in the right lower leg and right elbow  Skin:    General: Skin is warm and dry.     Capillary Refill: Capillary refill takes less than 2 seconds.     Comments: Abrasion to right shin, right elbow  Neurological:     General: No focal deficit present.     Mental Status: She is alert and oriented to person, place, and time.     Cranial Nerves: No cranial nerve deficit.     Sensory: No sensory deficit.     Motor: No weakness.     Coordination: Coordination normal.     Comments: 5+ out of 5 strength throughout, normal sensation, no drift, normal speech     ED Results / Procedures / Treatments   Labs (all labs ordered are listed, but only abnormal results are displayed) Labs Reviewed - No data to display  EKG None  Radiology CT Head Wo Contrast  Result Date: 10/15/2019 CLINICAL DATA:  Fall with head trauma EXAM: CT HEAD WITHOUT CONTRAST CT CERVICAL SPINE WITHOUT CONTRAST TECHNIQUE: Multidetector CT imaging of the head and cervical spine was performed following the standard protocol without intravenous contrast. Multiplanar CT image reconstructions of the cervical spine were also generated. COMPARISON:  Head CT 08/30/2019.   Cervical spine CT 10/28/2017 FINDINGS: CT HEAD FINDINGS Brain: No evidence of acute infarction, hemorrhage, hydrocephalus, extra-axial collection or mass lesion/mass effect. Overall mild cortical volume loss for age, more notable in the right temporal lobe. Vascular: No hyperdense vessel or unexpected calcification. Skull: Right scalp hematoma without calvarial fracture Sinuses/Orbits: No evidence of injury Other: Initially motion degraded, requiring 2 acquisitions. CT CERVICAL SPINE FINDINGS Alignment: No traumatic malalignment. Degenerative reversal of cervical lordosis Skull base and vertebrae: No acute fracture. A small lucency at the posterosuperior corner of C3 was also present in 2019. T1 hemangioma. Soft tissues and spinal canal: No prevertebral fluid or swelling. No visible canal hematoma. Disc levels: Generalized disc and facet degeneration in keeping with age. Upper chest: Negative IMPRESSION: 1. No evidence of acute intracranial or cervical spine injury. 2. Right scalp hematoma without acute fracture. Electronically Signed   By: Monte Fantasia M.D.   On: 10/15/2019 10:40   CT Cervical Spine Wo Contrast  Result Date: 10/15/2019 CLINICAL DATA:  Fall with head trauma EXAM: CT HEAD WITHOUT CONTRAST CT CERVICAL SPINE WITHOUT CONTRAST TECHNIQUE: Multidetector CT imaging of the head and cervical spine was performed following the standard protocol without intravenous contrast. Multiplanar CT image reconstructions of the cervical spine were also generated. COMPARISON:  Head CT 08/30/2019.  Cervical spine CT 10/28/2017 FINDINGS: CT HEAD FINDINGS Brain: No evidence of acute infarction, hemorrhage, hydrocephalus, extra-axial collection or mass lesion/mass effect. Overall mild cortical volume loss for age, more notable in the right temporal lobe. Vascular: No hyperdense vessel or unexpected calcification. Skull: Right scalp hematoma without calvarial fracture Sinuses/Orbits: No evidence of injury Other:  Initially motion degraded, requiring 2 acquisitions. CT CERVICAL SPINE FINDINGS Alignment: No traumatic malalignment. Degenerative reversal of  cervical lordosis Skull base and vertebrae: No acute fracture. A small lucency at the posterosuperior corner of C3 was also present in 2019. T1 hemangioma. Soft tissues and spinal canal: No prevertebral fluid or swelling. No visible canal hematoma. Disc levels: Generalized disc and facet degeneration in keeping with age. Upper chest: Negative IMPRESSION: 1. No evidence of acute intracranial or cervical spine injury. 2. Right scalp hematoma without acute fracture. Electronically Signed   By: Monte Fantasia M.D.   On: 10/15/2019 10:40    Procedures Procedures (including critical care time)  Medications Ordered in ED Medications - No data to display  ED Course  I have reviewed the triage vital signs and the nursing notes.  Pertinent labs & imaging results that were available during my care of the patient were reviewed by me and considered in my medical decision making (see chart for details).    MDM Rules/Calculators/A&P                          Carrie Short is an 86 year old female who presents the ED after mechanical fall.  Normal vitals.  No fever.  Patient fell while using her walker.  Hit the right side of her head.  Has large hematoma in the right side of her head.  Not on blood thinners.  Skin tear to her right elbow and right lower leg with no tenderness in these areas.  Good range of motion throughout all extremities.  Clear breath sounds.  No midline spinal pain.  Head and neck CT were obtained that showed no acute findings.  No head bleed.  Given reassurance and discharged in the ED in good condition.  This chart was dictated using voice recognition software.  Despite best efforts to proofread,  errors can occur which can change the documentation meaning.     Final Clinical Impression(s) / ED Diagnoses Final diagnoses:  Contusion of head,  unspecified part of head, initial encounter    Rx / DC Orders ED Discharge Orders    None       Lennice Sites, DO 10/15/19 1051

## 2019-10-26 ENCOUNTER — Encounter (INDEPENDENT_AMBULATORY_CARE_PROVIDER_SITE_OTHER): Payer: Medicare Other | Admitting: Ophthalmology

## 2019-10-26 ENCOUNTER — Other Ambulatory Visit: Payer: Self-pay

## 2019-10-26 DIAGNOSIS — H35033 Hypertensive retinopathy, bilateral: Secondary | ICD-10-CM

## 2019-10-26 DIAGNOSIS — H353221 Exudative age-related macular degeneration, left eye, with active choroidal neovascularization: Secondary | ICD-10-CM | POA: Diagnosis not present

## 2019-10-26 DIAGNOSIS — H353112 Nonexudative age-related macular degeneration, right eye, intermediate dry stage: Secondary | ICD-10-CM | POA: Diagnosis not present

## 2019-10-26 DIAGNOSIS — I10 Essential (primary) hypertension: Secondary | ICD-10-CM | POA: Diagnosis not present

## 2019-10-26 DIAGNOSIS — H43813 Vitreous degeneration, bilateral: Secondary | ICD-10-CM

## 2019-11-30 ENCOUNTER — Encounter (INDEPENDENT_AMBULATORY_CARE_PROVIDER_SITE_OTHER): Payer: Medicare Other | Admitting: Ophthalmology

## 2019-11-30 ENCOUNTER — Other Ambulatory Visit: Payer: Self-pay

## 2019-11-30 DIAGNOSIS — H35033 Hypertensive retinopathy, bilateral: Secondary | ICD-10-CM

## 2019-11-30 DIAGNOSIS — I10 Essential (primary) hypertension: Secondary | ICD-10-CM

## 2019-11-30 DIAGNOSIS — H353221 Exudative age-related macular degeneration, left eye, with active choroidal neovascularization: Secondary | ICD-10-CM

## 2019-11-30 DIAGNOSIS — H353112 Nonexudative age-related macular degeneration, right eye, intermediate dry stage: Secondary | ICD-10-CM

## 2019-11-30 DIAGNOSIS — H43813 Vitreous degeneration, bilateral: Secondary | ICD-10-CM

## 2019-12-12 ENCOUNTER — Ambulatory Visit: Payer: Medicare Other | Admitting: Diagnostic Neuroimaging

## 2019-12-12 ENCOUNTER — Encounter: Payer: Self-pay | Admitting: Diagnostic Neuroimaging

## 2019-12-12 VITALS — BP 120/78 | HR 84 | Ht 65.0 in | Wt 162.2 lb

## 2019-12-12 DIAGNOSIS — R413 Other amnesia: Secondary | ICD-10-CM

## 2019-12-12 DIAGNOSIS — G40209 Localization-related (focal) (partial) symptomatic epilepsy and epileptic syndromes with complex partial seizures, not intractable, without status epilepticus: Secondary | ICD-10-CM

## 2019-12-12 DIAGNOSIS — G20 Parkinson's disease: Secondary | ICD-10-CM | POA: Diagnosis not present

## 2019-12-12 DIAGNOSIS — G2581 Restless legs syndrome: Secondary | ICD-10-CM

## 2019-12-12 DIAGNOSIS — F0390 Unspecified dementia without behavioral disturbance: Secondary | ICD-10-CM

## 2019-12-12 DIAGNOSIS — F03A0 Unspecified dementia, mild, without behavioral disturbance, psychotic disturbance, mood disturbance, and anxiety: Secondary | ICD-10-CM

## 2019-12-12 MED ORDER — ROPINIROLE HCL 1 MG PO TABS: 1.0000 mg | ORAL_TABLET | Freq: Two times a day (BID) | ORAL | 4 refills | Status: AC

## 2019-12-12 MED ORDER — LAMOTRIGINE 100 MG PO TABS: 100.0000 mg | ORAL_TABLET | Freq: Every day | ORAL | 4 refills | Status: AC

## 2019-12-12 NOTE — Patient Instructions (Signed)
-  continue current medications

## 2019-12-12 NOTE — Progress Notes (Signed)
[facility]  PATIENT: Carrie Short DOB: 05-Aug-1927  REFERRING CLINICIAN:  HISTORY FROM: patient and caregiver REASON FOR VISIT: follow up   HISTORICAL  CHIEF COMPLAINT:  Chief Complaint  Patient presents with  . Dementia    rm 6 "dementia worsening" caregiver- Claudia   MMSE 18    HISTORY OF PRESENT ILLNESS:   UPDATE (12/12/19, VRP): Since last visit, doing poorly; more interrupted sleep disturbance. taking some sleeping pill per PCP; slightly helps. Tends to wake up in middle of night and wander, and then fall down.   UPDATE (07/12/17, VRP): Since last visit, doing worse. More falls. More memory loss. Tolerating meds. Not taking carb/levo regularly. No alleviating or aggravating factors. Poor insight. Limited supervision at home.   UPDATE (12/29/16, VRP): Since last visit, doing well. Tolerating meds. No alleviating or aggravating factors. Some left eye blurred vision. Some more balance issues. No more driving.   UPDATE 12/26/15: Since last visit, continues to fall. Not using a walker. Still mowing yard.   UPDATE 06/06/15: Since last visit, doing well. No sz. Continues on ropinirole. Memory loss issues stable. Still feels unsteady with walking.   UPDATE 11/28/14: Doing well. On nightly ropinirole 1mg  qhs. 5-10 days per month, takes an extra tab as needed for breakthrough tremors. Overall stable. Has inverted sleep schedule. No seizures. Still has occ trips and falls.   UPDATE 08/28/14: Since last visit, has transitioned away from driving. Now son stays with her at night. No sz. Doing well on ropinirole. Having more nausea, and PCP advised to try stopping ropinirole. No benefit with nausea. Now pt back on ropinirole.  UPDATE 05/29/14: since last visit, has been using daily ropinirole 1mg  5x per month, and continues every nightly 1mg  dose. Some benefit with tremor. Also tried PT, with some benefit. Not using walker. Daughter concerned about memory loss and pt ability  to drive. Daughter reports pt gets lost when driving, suddenly pulls out in front of other cars, and is not safe. Patient also concerned about left supra-orbital swelling and headache.  UPDATE 12/27/13: Since last visit, more tremor, more gait diff. No seizures. Memory loss slightly worse. Still lives at home. Son helps her with yard work. She fell a few months ago, and had right wrist fracture.   UPDATE 06/28/13: Since last visit, more knee/hip/back pain. Restless legs slightly worse. Now with tremor (with handwriting). More balance difficulty. Also with some shortness of breath and emphysematous changes on lung testing.  UPDATE 12/26/12: Since last visit, stable. Some intermittent HA, left sided. Some intermittent left facial numbness. Some memory loss. Got lost driving to this appt this AM, and had to call her daughter for directions.  Still lives alone (with dog). Daughter and son call daily to check on her.  PRIOR HPI (06/13/12, Dr. Erling Cruz):  86 year old right-handed white widowed female with a 3-1/2 year history of hesitancy in speech, using neologisms and making inappropriate comments, at times associated with a blank stare. I saw her 12/08/2008 with normal neurologic examination, MMSE 25/30, CDT 4/4, AFT 10.  I started her on Aricept,venlafaxine was increased, and aspirin was continued. Evaluation included sedimentation rate, RPR, TSH, B12, and  CPK which were normal. MRI study of the brain 06/07/2008 was felt to be normal. CT scans of the brain 05/06/2007 and  10/12/2008 showed minimal small vessel disease and atrophy. EEG 12/13/2008 showed left-sided intermittent  slowing in the temporal  region. Repeat MRI study 03/04/2009 showed an asymmetry in the left temporal  lobe versus the right, being larger, and having T1 signal alterations on the medial aspect and brighter signal in the left mesial temporal lobe on flair. 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 In review of the MRI 06/07/2008. Dr. Derrill Memo at [facility] performed a stereotactic left temporal  biopsy 06/13/2009 with no pathologic diagnosis of  tumor present.  There was astrocytic hypertrophy and there were "hypertensive vascular changes present".  She did not feel "generally well" without documented seizures. She complained of lack of energy.She was taken off of levetiracetam and  started on lamotrigine, progressing to 100 mg, one half twice daily. She states that she is confused at times with difficulty thinking of words. She denies auras of seizures. She occasionally stumbles. She does not notice language problems. Functionally she can cook, shop, clean, do laundry, make the bed and dishes, and mow the yard. She drives. She lives alone She has lost interest in traveling.  She has spells that come over her with ringing of her hands and moving her feet and cannot  be still. These occur while sitting or at nighttime without loss of consciousness  and last 15 minutes. She treats for restless leg symptoms with Aleve.They usually occur when she is going to bed or sitting on the sofa.She occasionally has left-sided headache.  She was  seen by Dr. Yetta Glassman 11/30/2011. Her MRI report 11/29/2011 was not changed in the left hypertrophied temporal lobe according to the patient.she is to see him in two years. She says her memory is okay. She enjoys socializing with a group of friends at [employer] having her breakfast. 06/13/2012=( MMSE 25/30. Clock drawing task 4/4.Animal fluency test 9. Ron Parker  Index of independence in activities of daily 6. Bubba [place] instrumental activities of daily living scale 8. Neuropsychiatric inventory=  Apathy 4. Geriatric depression scale 1/15.Falls assessment tool score 15).   REVIEW OF SYSTEMS: Full 14 system review of systems performed and negative except: loss of vision tremors.   ALLERGIES: No Known Allergies  HOME MEDICATIONS:  Outpatient Medications Prior to  Visit  Medication Sig Dispense Refill  . alendronate (FOSAMAX) 70 MG tablet Take 70 mg by mouth once a week. Sundays  4  . amLODipine (NORVASC) 2.5 MG tablet Take 2.5 mg by mouth daily.  4  . metFORMIN (GLUCOPHAGE) 500 MG tablet Take 500 mg by mouth daily.    . metoprolol succinate (TOPROL-XL) 50 MG 24 hr tablet Take 50 mg by mouth daily.  4  . omeprazole (PRILOSEC) 20 MG capsule Take 20 mg by mouth daily.  4  . rOPINIRole (REQUIP) 1 MG tablet Take 1 tablet (1 mg total) by mouth 2 (two) times daily. 180 tablet 4  . venlafaxine XR (EFFEXOR-XR) 75 MG 24 hr capsule Take 75 mg by mouth daily.  3  . Calcium Carbonate-Vitamin D (CALCIUM 600 + D PO) Take 1 tablet by mouth daily.  (Patient not taking: Reported on 12/12/2019)    . lamoTRIgine (LAMICTAL) 100 MG tablet TAKE 1 TABLET BY MOUTH EVERY DAY (Patient not taking: Reported on 12/12/2019) 90 tablet 0  . LORazepam (ATIVAN) 1 MG tablet Take 1 mg by mouth every 8 (eight) hours as needed for sleep (tremors).  (Patient not taking: Reported on 12/12/2019)    . Naproxen Sodium (ALEVE PO) Take 220 mg by mouth as needed (pain).  (Patient not taking: Reported on 12/12/2019)    . oxyCODONE-acetaminophen (PERCOCET/ROXICET) 5-325 MG  tablet Take 1 tablet by mouth every 4 (four) hours as needed for severe pain. (Patient not taking: Reported on 12/12/2019) 15 tablet 0  . predniSONE (DELTASONE) 10 MG tablet Take 2 tablets (20 mg total) by mouth 2 (two) times daily with a meal. (Patient not taking: Reported on 12/12/2019) 20 tablet 0  . traMADol (ULTRAM) 50 MG tablet Take 1 tablet (50 mg total) by mouth every 6 (six) hours as needed. (Patient not taking: Reported on 12/12/2019) 15 tablet 0   No facility-administered medications prior to visit.    PAST MEDICAL HISTORY: Past Medical History:  Diagnosis Date  . Anxiety   . Arthritis    L arm, hips & knees - recently had injection in R  knee - Dr. Lorene Dy   . Brain tumor (benign) ([place]) 2010   has consulted /w Dr.  Tommi Rumps at [facility], AUG 2013- had MRI  . Cataract   . Dysrhythmia    "my heart skips some beats"  . GERD (gastroesophageal reflux disease)   . H/O hiatal hernia   . Headache(784.0)    pt. believes the recent headaches are related to "brain tumor", states she has a followup appt. wuith Neurologist in Sept.    . Heart murmur   . High cholesterol   . Hypertension   . Memory loss   . Parkinsonism ([place])   . Seizures ([place])    prior to diagnosed /w brain tumor, on lamictal since    . Shortness of breath    /w exertion     PAST SURGICAL HISTORY: Past Surgical History:  Procedure Laterality Date  . ABDOMINAL HYSTERECTOMY  1990s  . BRAIN BIOPSY  2010   L temple  . BREAST BIOPSY Right 12/12/2012   Procedure: Right breast wire guided excision;  Surgeon: Rolm Bookbinder, MD;  Location: [facility];  Service: General;  Laterality: Right;  . BREAST EXCISIONAL BIOPSY Right    No scars seen   . BUNIONECTOMY     left foot  . EYE SURGERY    . KNEE SURGERY Left 2006    FAMILY HISTORY: Family History  Problem Relation Age of Onset  . Heart disease Mother   . Heart attack Mother   . Cancer Daughter        breast mets to lung  . Breast cancer Daughter   . Diabetes Son   . Colon cancer Father     SOCIAL HISTORY:  Social History   Socioeconomic History  . Marital status: Widowed    Spouse name: Not on file  . Number of children: 2  . Years of education: College  . Highest education level: Not on file  Occupational History  . Occupation: Retired    Fish farm manager: RETIRED  Tobacco Use  . Smoking status: Never Smoker  . Smokeless tobacco: Never Used  Vaping Use  . Vaping Use: Never used  Substance and Sexual Activity  . Alcohol use: No  . Drug use: No  . Sexual activity: Not on file  Other Topics Concern  . Not on file  Social History Narrative   12/12/19 Patient lives at home with caregiver and son full time   Caffeine Use: 1/2 cup a day    Social Determinants of Health    Financial Resource Strain:   . Difficulty of Paying Living Expenses: Not on file  Food Insecurity:   . Worried About Charity fundraiser in the Last Year: Not on file  . Ran Out of Food in the  Last Year: Not on file  Transportation Needs:   . Lack of Transportation (Medical): Not on file  . Lack of Transportation (Non-Medical): Not on file  Physical Activity:   . Days of Exercise per Week: Not on file  . Minutes of Exercise per Session: Not on file  Stress:   . Feeling of Stress : Not on file  Social Connections:   . Frequency of Communication with Friends and Family: Not on file  . Frequency of Social Gatherings with Friends and Family: Not on file  . Attends Religious Services: Not on file  . Active Member of Clubs or Organizations: Not on file  . Attends Archivist Meetings: Not on file  . Marital Status: Not on file  Intimate Partner Violence:   . Fear of Current or Ex-Partner: Not on file  . Emotionally Abused: Not on file  . Physically Abused: Not on file  . Sexually Abused: Not on file    PHYSICAL EXAM  GENERAL EXAM/CONSTITUTIONAL: Vitals:  Vitals:   12/12/19 0843  BP: 120/78  Pulse: 84  Weight: 162 lb 3.2 oz (73.6 kg)  Height: 5\' 5"  (1.651 m)     Body mass index is 26.99 kg/m. Wt Readings from Last 3 Encounters:  12/12/19 162 lb 3.2 oz (73.6 kg)  10/15/19 160 lb (72.6 kg)  08/20/19 170 lb (77.1 kg)     Patient is in no distress; well developed, nourished and groomed; neck is supple  CARDIOVASCULAR:  Examination of carotid arteries is normal; no carotid bruits  Regular rate and rhythm, no murmurs  Examination of peripheral vascular system by observation and palpation is normal  EYES:  Ophthalmoscopic exam of optic discs and posterior segments is normal; no papilledema or hemorrhages  No exam data present  MUSCULOSKELETAL:  Gait, strength, tone, movements noted in Neurologic exam below  NEUROLOGIC: MENTAL STATUS:  MMSE - [place] Exam 12/12/2019 07/12/2017 12/26/2015  Orientation to time 3 2 3   Orientation to Place 4 3 5   Registration 3 3 3   Attention/ Calculation 0 0 3  Recall 1 3 2   Language- name 2 objects 2 2 2   Language- repeat 0 1 0  Language- follow 3 step command 3 2 3   Language- read & follow direction 1 1 1   Write a sentence 1 1 1   Copy design 0 1 1  Total score 18 19 24     SLEEPY; oriented to person  DECR memory   DECR attention and concentration  language fluent, comprehension intact, naming intact  fund of knowledge appropriate  CRANIAL NERVE:   2nd - no papilledema on fundoscopic exam  2nd, 3rd, 4th, 6th - pupils equal and reactive to light, visual fields full to confrontation, extraocular muscles intact, no nystagmus  5th - facial sensation symmetric  7th - facial strength symmetric  8th - hearing intact  9th - palate elevates symmetrically, uvula midline  11th - shoulder shrug symmetric  12th - tongue protrusion midline  MOTOR:   normal bulk and tone, DIFFUSE 4/5 strength in the BUE, BLE  MILD POSTURAL TREMOR IN BUE  MILD BRADYKINESIA  SENSORY:   normal and symmetric to light touch  COORDINATION:   finger-nose-finger, fine finger movements SLOW  REFLEXES:   deep tendon reflexes TRACE and symmetric  GAIT/STATION:   USING WALKER     DIAGNOSTIC DATA (LABS, IMAGING, TESTING) - I reviewed patient records, labs, notes, testing and imaging myself where available.  Lab Results  Component Value Date  WBC 5.9 10/28/2017   HGB 14.7 10/28/2017   HCT 45.7 10/28/2017   MCV 96.8 10/28/2017   PLT 110 (L) 10/28/2017      Component Value Date/Time   NA 142 10/28/2017 1914   K 4.0 10/28/2017 1914   CL 105 10/28/2017 1914   CO2 29 10/28/2017 1914   GLUCOSE 169 (H) 10/28/2017 1914   BUN 13 10/28/2017 1914   CREATININE 0.76 10/28/2017 1914   CALCIUM 9.1 10/28/2017 1914   PROT 6.8 10/28/2017 1914   ALBUMIN 3.2 (L) 10/28/2017 1914   AST 86 (H)  10/28/2017 1914   ALT 61 (H) 10/28/2017 1914   ALKPHOS 185 (H) 10/28/2017 1914   BILITOT 0.7 10/28/2017 1914   GFRNONAA >60 10/28/2017 1914   GFRAA >60 10/28/2017 1914   No results found for: CHOL No results found for: HGBA1C No results found for: VITAMINB12 No results found for: TSH  06/29/12 MRI brain (with and without contrast) demonstrating: 1. Slight asymmetry of the mesial temporal lobes, with the left side slightly larger than the right. 2. Mild perisylvian and right mesial temporal atrophy.  3. Mild periventricular and subcortical non-specific foci of gliosis, likely chronic small vessel ischemic disease. 4. Mucosal thickening and bubbly secretions in the ethmoid, frontal and maxillary sinuses. 5. Compared to prior MRI from 03/04/09 there is no significant interval change.   ASSESSMENT AND PLAN  86 y.o. year old female here with history of complex partial seizures, abnormal MRI brain (s/p left temporal lobe biopsy --> astrocytic hypertrophy and hypertensive vascular changes; not clearly neoplastic). Also with RLS on ropinirole. Also with parkinsonian features and memory loss (progressing).   meds tried: carbidopa/levodopa (not effective)   Dx:   Parkinson's disease ([place])  Memory loss  Partial symptomatic epilepsy with complex partial seizures, not intractable, without status epilepticus ([place])  Restless leg syndrome  Mild dementia (H[facility])     PLAN:  PARKINSON'S DISEASE (stable) - continue ropinirole 1mg  twice a day for RLS + parkinsonian features - use rollator walker; increase supervision; high fall risk  MEMORY LOSS / DEMENTIA (worsening; MMSE 18/30) - continue safety and supervision; patient needs supervision for medication management  SEIZURE PREVENTION  - continue lamotrigine (100mg  daily) for seizure prevention  ANXIETY / DEPRESSION / INSOMNIA (worsening) - per PCP; consider psychiatry follow up  Meds ordered this encounter  Medications  .  lamoTRIgine (LAMICTAL) 100 MG tablet    Sig: Take 1 tablet (100 mg total) by mouth daily.    Dispense:  90 tablet    Refill:  4  . rOPINIRole (REQUIP) 1 MG tablet    Sig: Take 1 tablet (1 mg total) by mouth 2 (two) times daily.    Dispense:  180 tablet    Refill:  4   Return in about 1 year (around 12/11/2020) for with NP (Amy Lomax).     Penni Bombard, MD 9/98/3382, 5:05 AM Certified in Neurology, Neurophysiology and Neuroimaging  [facility] [address], [place], [place] [address] [phone]

## 2019-12-28 ENCOUNTER — Encounter (INDEPENDENT_AMBULATORY_CARE_PROVIDER_SITE_OTHER): Payer: Medicare Other | Admitting: Ophthalmology

## 2020-01-01 ENCOUNTER — Other Ambulatory Visit: Payer: Self-pay

## 2020-01-01 ENCOUNTER — Encounter (INDEPENDENT_AMBULATORY_CARE_PROVIDER_SITE_OTHER): Payer: Medicare Other | Admitting: Ophthalmology

## 2020-01-01 DIAGNOSIS — H43813 Vitreous degeneration, bilateral: Secondary | ICD-10-CM

## 2020-01-01 DIAGNOSIS — H353221 Exudative age-related macular degeneration, left eye, with active choroidal neovascularization: Secondary | ICD-10-CM

## 2020-01-01 DIAGNOSIS — I10 Essential (primary) hypertension: Secondary | ICD-10-CM | POA: Diagnosis not present

## 2020-01-01 DIAGNOSIS — H34832 Tributary (branch) retinal vein occlusion, left eye, with macular edema: Secondary | ICD-10-CM | POA: Diagnosis not present

## 2020-01-01 DIAGNOSIS — H353112 Nonexudative age-related macular degeneration, right eye, intermediate dry stage: Secondary | ICD-10-CM

## 2020-01-01 DIAGNOSIS — H35033 Hypertensive retinopathy, bilateral: Secondary | ICD-10-CM

## 2020-01-29 ENCOUNTER — Encounter (INDEPENDENT_AMBULATORY_CARE_PROVIDER_SITE_OTHER): Payer: Medicare Other | Admitting: Ophthalmology

## 2020-02-15 ENCOUNTER — Other Ambulatory Visit: Payer: Self-pay

## 2020-02-15 ENCOUNTER — Encounter (INDEPENDENT_AMBULATORY_CARE_PROVIDER_SITE_OTHER): Payer: Medicare Other | Admitting: Ophthalmology

## 2020-02-15 DIAGNOSIS — I10 Essential (primary) hypertension: Secondary | ICD-10-CM

## 2020-02-15 DIAGNOSIS — H35033 Hypertensive retinopathy, bilateral: Secondary | ICD-10-CM | POA: Diagnosis not present

## 2020-02-15 DIAGNOSIS — H34832 Tributary (branch) retinal vein occlusion, left eye, with macular edema: Secondary | ICD-10-CM

## 2020-02-15 DIAGNOSIS — H353221 Exudative age-related macular degeneration, left eye, with active choroidal neovascularization: Secondary | ICD-10-CM | POA: Diagnosis not present

## 2020-02-15 DIAGNOSIS — H43813 Vitreous degeneration, bilateral: Secondary | ICD-10-CM

## 2020-02-15 DIAGNOSIS — H353112 Nonexudative age-related macular degeneration, right eye, intermediate dry stage: Secondary | ICD-10-CM

## 2020-03-21 ENCOUNTER — Other Ambulatory Visit: Payer: Self-pay

## 2020-03-21 ENCOUNTER — Encounter (INDEPENDENT_AMBULATORY_CARE_PROVIDER_SITE_OTHER): Payer: Medicare Other | Admitting: Ophthalmology

## 2020-03-21 DIAGNOSIS — H35033 Hypertensive retinopathy, bilateral: Secondary | ICD-10-CM | POA: Diagnosis not present

## 2020-03-21 DIAGNOSIS — I10 Essential (primary) hypertension: Secondary | ICD-10-CM

## 2020-03-21 DIAGNOSIS — H353231 Exudative age-related macular degeneration, bilateral, with active choroidal neovascularization: Secondary | ICD-10-CM

## 2020-03-21 DIAGNOSIS — H34832 Tributary (branch) retinal vein occlusion, left eye, with macular edema: Secondary | ICD-10-CM | POA: Diagnosis not present

## 2020-03-21 DIAGNOSIS — H43813 Vitreous degeneration, bilateral: Secondary | ICD-10-CM

## 2020-04-25 ENCOUNTER — Other Ambulatory Visit: Payer: Self-pay

## 2020-04-25 ENCOUNTER — Encounter (INDEPENDENT_AMBULATORY_CARE_PROVIDER_SITE_OTHER): Payer: Medicare Other | Admitting: Ophthalmology

## 2020-04-25 DIAGNOSIS — H34832 Tributary (branch) retinal vein occlusion, left eye, with macular edema: Secondary | ICD-10-CM

## 2020-04-25 DIAGNOSIS — H35033 Hypertensive retinopathy, bilateral: Secondary | ICD-10-CM | POA: Diagnosis not present

## 2020-04-25 DIAGNOSIS — I10 Essential (primary) hypertension: Secondary | ICD-10-CM | POA: Diagnosis not present

## 2020-04-25 DIAGNOSIS — H353231 Exudative age-related macular degeneration, bilateral, with active choroidal neovascularization: Secondary | ICD-10-CM

## 2020-04-25 DIAGNOSIS — H43813 Vitreous degeneration, bilateral: Secondary | ICD-10-CM

## 2020-05-15 ENCOUNTER — Emergency Department (HOSPITAL_COMMUNITY): Payer: Medicare Other

## 2020-05-15 ENCOUNTER — Inpatient Hospital Stay (HOSPITAL_COMMUNITY)
Admission: EM | Admit: 2020-05-15 | Discharge: 2020-05-21 | DRG: 871 | Disposition: E | Payer: Medicare Other | Attending: Internal Medicine | Admitting: Internal Medicine

## 2020-05-15 ENCOUNTER — Encounter (HOSPITAL_COMMUNITY): Payer: Self-pay | Admitting: Emergency Medicine

## 2020-05-15 DIAGNOSIS — R011 Cardiac murmur, unspecified: Secondary | ICD-10-CM | POA: Diagnosis present

## 2020-05-15 DIAGNOSIS — E785 Hyperlipidemia, unspecified: Secondary | ICD-10-CM | POA: Diagnosis present

## 2020-05-15 DIAGNOSIS — Z7984 Long term (current) use of oral hypoglycemic drugs: Secondary | ICD-10-CM

## 2020-05-15 DIAGNOSIS — M160 Bilateral primary osteoarthritis of hip: Secondary | ICD-10-CM | POA: Diagnosis present

## 2020-05-15 DIAGNOSIS — E872 Acidosis: Secondary | ICD-10-CM | POA: Diagnosis present

## 2020-05-15 DIAGNOSIS — Z66 Do not resuscitate: Secondary | ICD-10-CM | POA: Diagnosis present

## 2020-05-15 DIAGNOSIS — W1830XA Fall on same level, unspecified, initial encounter: Secondary | ICD-10-CM | POA: Diagnosis present

## 2020-05-15 DIAGNOSIS — A419 Sepsis, unspecified organism: Secondary | ICD-10-CM | POA: Diagnosis not present

## 2020-05-15 DIAGNOSIS — J1282 Pneumonia due to coronavirus disease 2019: Secondary | ICD-10-CM | POA: Diagnosis present

## 2020-05-15 DIAGNOSIS — K219 Gastro-esophageal reflux disease without esophagitis: Secondary | ICD-10-CM | POA: Diagnosis present

## 2020-05-15 DIAGNOSIS — R6521 Severe sepsis with septic shock: Secondary | ICD-10-CM | POA: Diagnosis present

## 2020-05-15 DIAGNOSIS — R4182 Altered mental status, unspecified: Secondary | ICD-10-CM | POA: Diagnosis present

## 2020-05-15 DIAGNOSIS — Z7189 Other specified counseling: Secondary | ICD-10-CM | POA: Diagnosis not present

## 2020-05-15 DIAGNOSIS — U071 COVID-19: Secondary | ICD-10-CM | POA: Diagnosis present

## 2020-05-15 DIAGNOSIS — E039 Hypothyroidism, unspecified: Secondary | ICD-10-CM | POA: Diagnosis present

## 2020-05-15 DIAGNOSIS — A4189 Other specified sepsis: Secondary | ICD-10-CM | POA: Diagnosis present

## 2020-05-15 DIAGNOSIS — N179 Acute kidney failure, unspecified: Secondary | ICD-10-CM | POA: Diagnosis present

## 2020-05-15 DIAGNOSIS — Z8249 Family history of ischemic heart disease and other diseases of the circulatory system: Secondary | ICD-10-CM

## 2020-05-15 DIAGNOSIS — J9601 Acute respiratory failure with hypoxia: Principal | ICD-10-CM | POA: Diagnosis present

## 2020-05-15 DIAGNOSIS — Z90710 Acquired absence of both cervix and uterus: Secondary | ICD-10-CM

## 2020-05-15 DIAGNOSIS — Z515 Encounter for palliative care: Secondary | ICD-10-CM | POA: Diagnosis not present

## 2020-05-15 DIAGNOSIS — R6520 Severe sepsis without septic shock: Secondary | ICD-10-CM | POA: Diagnosis not present

## 2020-05-15 DIAGNOSIS — G9341 Metabolic encephalopathy: Secondary | ICD-10-CM

## 2020-05-15 DIAGNOSIS — I10 Essential (primary) hypertension: Secondary | ICD-10-CM | POA: Diagnosis present

## 2020-05-15 DIAGNOSIS — M170 Bilateral primary osteoarthritis of knee: Secondary | ICD-10-CM | POA: Diagnosis present

## 2020-05-15 DIAGNOSIS — G20 Parkinson's disease: Secondary | ICD-10-CM | POA: Diagnosis present

## 2020-05-15 DIAGNOSIS — S20229A Contusion of unspecified back wall of thorax, initial encounter: Secondary | ICD-10-CM | POA: Diagnosis present

## 2020-05-15 DIAGNOSIS — E870 Hyperosmolality and hypernatremia: Secondary | ICD-10-CM | POA: Diagnosis present

## 2020-05-15 DIAGNOSIS — F0280 Dementia in other diseases classified elsewhere without behavioral disturbance: Secondary | ICD-10-CM | POA: Diagnosis present

## 2020-05-15 DIAGNOSIS — M1909 Primary osteoarthritis, other specified site: Secondary | ICD-10-CM | POA: Diagnosis present

## 2020-05-15 DIAGNOSIS — G2581 Restless legs syndrome: Secondary | ICD-10-CM | POA: Diagnosis present

## 2020-05-15 DIAGNOSIS — F419 Anxiety disorder, unspecified: Secondary | ICD-10-CM | POA: Diagnosis present

## 2020-05-15 DIAGNOSIS — R569 Unspecified convulsions: Secondary | ICD-10-CM | POA: Diagnosis present

## 2020-05-15 DIAGNOSIS — Z86011 Personal history of benign neoplasm of the brain: Secondary | ICD-10-CM

## 2020-05-15 DIAGNOSIS — Z79899 Other long term (current) drug therapy: Secondary | ICD-10-CM

## 2020-05-15 LAB — CBC WITH DIFFERENTIAL/PLATELET
Abs Immature Granulocytes: 0.05 10*3/uL (ref 0.00–0.07)
Basophils Absolute: 0 10*3/uL (ref 0.0–0.1)
Basophils Relative: 0 %
Eosinophils Absolute: 0 10*3/uL (ref 0.0–0.5)
Eosinophils Relative: 0 %
HCT: 40.7 % (ref 36.0–46.0)
Hemoglobin: 12.6 g/dL (ref 12.0–15.0)
Immature Granulocytes: 1 %
Lymphocytes Relative: 11 %
Lymphs Abs: 0.9 10*3/uL (ref 0.7–4.0)
MCH: 28.8 pg (ref 26.0–34.0)
MCHC: 31 g/dL (ref 30.0–36.0)
MCV: 93.1 fL (ref 80.0–100.0)
Monocytes Absolute: 0.6 10*3/uL (ref 0.1–1.0)
Monocytes Relative: 7 %
Neutro Abs: 6.6 10*3/uL (ref 1.7–7.7)
Neutrophils Relative %: 81 %
Platelets: 151 10*3/uL (ref 150–400)
RBC: 4.37 MIL/uL (ref 3.87–5.11)
RDW: 17.4 % — ABNORMAL HIGH (ref 11.5–15.5)
WBC: 8.2 10*3/uL (ref 4.0–10.5)
nRBC: 0.4 % — ABNORMAL HIGH (ref 0.0–0.2)

## 2020-05-15 LAB — COMPREHENSIVE METABOLIC PANEL
ALT: 27 U/L (ref 0–44)
AST: 74 U/L — ABNORMAL HIGH (ref 15–41)
Albumin: 3.1 g/dL — ABNORMAL LOW (ref 3.5–5.0)
Alkaline Phosphatase: 152 U/L — ABNORMAL HIGH (ref 38–126)
Anion gap: 16 — ABNORMAL HIGH (ref 5–15)
BUN: 40 mg/dL — ABNORMAL HIGH (ref 8–23)
CO2: 29 mmol/L (ref 22–32)
Calcium: 8.6 mg/dL — ABNORMAL LOW (ref 8.9–10.3)
Chloride: 101 mmol/L (ref 98–111)
Creatinine, Ser: 1.15 mg/dL — ABNORMAL HIGH (ref 0.44–1.00)
GFR, Estimated: 45 mL/min — ABNORMAL LOW (ref >60)
Glucose, Bld: 174 mg/dL — ABNORMAL HIGH (ref 70–99)
Potassium: 3.5 mmol/L (ref 3.5–5.1)
Sodium: 146 mmol/L — ABNORMAL HIGH (ref 135–145)
Total Bilirubin: 1.8 mg/dL — ABNORMAL HIGH (ref 0.3–1.2)
Total Protein: 6.7 g/dL (ref 6.5–8.1)

## 2020-05-15 LAB — BLOOD GAS, VENOUS
Acid-Base Excess: 5.4 mmol/L — ABNORMAL HIGH (ref 0.0–2.0)
Bicarbonate: 29.2 mmol/L — ABNORMAL HIGH (ref 20.0–28.0)
O2 Saturation: 92.1 %
Patient temperature: 98.6
pCO2, Ven: 41.1 mmHg — ABNORMAL LOW (ref 44.0–60.0)
pH, Ven: 7.466 — ABNORMAL HIGH (ref 7.250–7.430)
pO2, Ven: 65.9 mmHg — ABNORMAL HIGH (ref 32.0–45.0)

## 2020-05-15 LAB — TROPONIN I (HIGH SENSITIVITY): Troponin I (High Sensitivity): 28 ng/L — ABNORMAL HIGH (ref <18)

## 2020-05-15 LAB — LACTIC ACID, PLASMA
Lactic Acid, Venous: 4.7 mmol/L (ref 0.5–1.9)
Lactic Acid, Venous: 3.1 mmol/L (ref 0.5–1.9)

## 2020-05-15 LAB — POC SARS CORONAVIRUS 2 AG -  ED: SARS Coronavirus 2 Ag: POSITIVE — AB

## 2020-05-15 LAB — LACTATE DEHYDROGENASE: LDH: 546 U/L — ABNORMAL HIGH (ref 98–192)

## 2020-05-15 LAB — C-REACTIVE PROTEIN: CRP: 8.4 mg/dL — ABNORMAL HIGH (ref <1.0)

## 2020-05-15 LAB — FERRITIN: Ferritin: 168 ng/mL (ref 11–307)

## 2020-05-15 LAB — BRAIN NATRIURETIC PEPTIDE: B Natriuretic Peptide: 910 pg/mL — ABNORMAL HIGH (ref 0.0–100.0)

## 2020-05-15 LAB — TRIGLYCERIDES: Triglycerides: 120 mg/dL (ref <150)

## 2020-05-15 LAB — I-STAT BETA HCG BLOOD, ED (MC, WL, AP ONLY): I-stat hCG, quantitative: <5 m[IU]/mL (ref <5)

## 2020-05-15 LAB — PROCALCITONIN: Procalcitonin: 0.23 ng/mL

## 2020-05-15 LAB — SARS CORONAVIRUS 2 BY RT PCR (HOSPITAL ORDER, PERFORMED IN ~~LOC~~ HOSPITAL LAB): SARS Coronavirus 2: POSITIVE — AB

## 2020-05-15 LAB — FIBRINOGEN: Fibrinogen: 363 mg/dL (ref 210–475)

## 2020-05-15 LAB — D-DIMER, QUANTITATIVE: D-Dimer, Quant: 3.21 ug/mL-FEU — ABNORMAL HIGH (ref 0.00–0.50)

## 2020-05-15 MED ORDER — GLYCOPYRROLATE 0.2 MG/ML IJ SOLN
0.2000 mg | INTRAMUSCULAR | Status: DC | PRN
  Administered 2020-05-16: 0.2 mg via INTRAVENOUS
  Filled 2020-05-15 (×2): qty 1

## 2020-05-15 MED ORDER — ACETAMINOPHEN 650 MG RE SUPP
650.0000 mg | Freq: Four times a day (QID) | RECTAL | Status: DC | PRN
  Administered 2020-05-15: 650 mg via RECTAL
  Filled 2020-05-15: qty 1

## 2020-05-15 MED ORDER — LORAZEPAM 2 MG/ML IJ SOLN
1.0000 mg | INTRAMUSCULAR | Status: DC | PRN
  Administered 2020-05-16: 1 mg via INTRAVENOUS
  Filled 2020-05-15 (×2): qty 1

## 2020-05-15 MED ORDER — MORPHINE BOLUS VIA INFUSION
1.0000 mg | INTRAVENOUS | Status: DC | PRN
  Administered 2020-05-15 (×2): 1 mg via INTRAVENOUS
  Filled 2020-05-15: qty 1

## 2020-05-15 MED ORDER — GLYCOPYRROLATE 0.2 MG/ML IJ SOLN
0.2000 mg | INTRAMUSCULAR | Status: DC | PRN
  Filled 2020-05-15: qty 1

## 2020-05-15 MED ORDER — SODIUM CHLORIDE 0.9 % IV SOLN: 500.0000 mg | Freq: Once | INTRAVENOUS | Status: DC

## 2020-05-15 MED ORDER — METOPROLOL TARTRATE 5 MG/5ML IV SOLN
5.0000 mg | Freq: Once | INTRAVENOUS | Status: DC
  Administered 2020-05-15: 5 mg via INTRAVENOUS

## 2020-05-15 MED ORDER — LORAZEPAM 1 MG PO TABS: 1.0000 mg | ORAL_TABLET | ORAL | Status: DC | PRN

## 2020-05-15 MED ORDER — FUROSEMIDE 10 MG/ML IJ SOLN
INTRAMUSCULAR | Status: AC
  Filled 2020-05-15: qty 4

## 2020-05-15 MED ORDER — SODIUM CHLORIDE 0.9 % IV SOLN: 2.0000 g | Freq: Once | INTRAVENOUS | Status: DC

## 2020-05-15 MED ORDER — MORPHINE 100MG IN NS 100ML (1MG/ML) PREMIX INFUSION
5.0000 mg/h | INTRAVENOUS | Status: DC
  Administered 2020-05-15 – 2020-05-16 (×2): 5 mg/h via INTRAVENOUS
  Filled 2020-05-15 (×2): qty 100

## 2020-05-15 MED ORDER — HALOPERIDOL LACTATE 5 MG/ML IJ SOLN
2.0000 mg | Freq: Once | INTRAMUSCULAR | Status: AC
  Administered 2020-05-15: 2 mg via INTRAVENOUS
  Filled 2020-05-15: qty 1

## 2020-05-15 MED ORDER — SODIUM CHLORIDE 0.9 % IV SOLN
200.0000 mg | Freq: Once | INTRAVENOUS | Status: DC
  Filled 2020-05-15: qty 40

## 2020-05-15 MED ORDER — DEXAMETHASONE SODIUM PHOSPHATE 10 MG/ML IJ SOLN
6.0000 mg | INTRAMUSCULAR | Status: DC
  Administered 2020-05-15: 6 mg via INTRAVENOUS
  Filled 2020-05-15: qty 1

## 2020-05-15 MED ORDER — METOPROLOL TARTRATE 5 MG/5ML IV SOLN
INTRAVENOUS | Status: AC
  Filled 2020-05-15: qty 5

## 2020-05-15 MED ORDER — SODIUM CHLORIDE 0.9 % IV SOLN: 100.0000 mg | Freq: Every day | INTRAVENOUS | Status: DC

## 2020-05-15 MED ORDER — SODIUM CHLORIDE 0.9 % IV BOLUS
1000.0000 mL | Freq: Once | INTRAVENOUS | Status: AC
  Administered 2020-05-15: 1000 mL via INTRAVENOUS

## 2020-05-15 MED ORDER — HALOPERIDOL LACTATE 5 MG/ML IJ SOLN
2.0000 mg | Freq: Once | INTRAMUSCULAR | Status: AC
  Administered 2020-05-15: 2 mg via INTRAMUSCULAR
  Filled 2020-05-15: qty 1

## 2020-05-15 MED ORDER — GLYCOPYRROLATE 1 MG PO TABS: 1.0000 mg | ORAL_TABLET | ORAL | Status: DC | PRN

## 2020-05-15 MED ORDER — LORAZEPAM 2 MG/ML PO CONC: 1.0000 mg | ORAL | Status: DC | PRN

## 2020-05-15 NOTE — ED Notes (Signed)
Pt placed on 6L oxygen pulse ox came up to 79%.

## 2020-05-15 NOTE — ED Provider Notes (Signed)
[facility] DEPT Provider Note   CSN: [id number] Arrival date & time: 04/24/2020  1226     History Chief Complaint  Patient presents with  . Altered Mental Status    Carrie Short is a 87 y.o. female.  Level 5 caveat due to altered mental status.  Patient has been confused and eating less over the last several days.  Did not know she had any falls.  Has not had Covid.  Has had vaccine but not sure she is had booster.  Patient had a fall last week and has some bruising to her back but did not seem to have much issues after the fall.  She is not on a blood thinner.  Patient has become increasingly lethargic over the last several days.  Patient is DNR per daughter.  The history is provided by a caregiver.  Altered Mental Status Presenting symptoms: confusion and disorientation   Severity:  Severe Most recent episode:  2 days ago Episode history:  Continuous Timing:  Constant Progression:  Worsening Chronicity:  New Context: dementia (parkinsons)        Past Medical History:  Diagnosis Date  . Anxiety   . Arthritis    L arm, hips & knees - recently had injection in R  knee - Dr. Lorene Dy   . Brain tumor (benign) ([place]) 2010   has consulted /w Dr. Tommi Rumps at [facility], AUG 2013- had MRI  . Cataract   . Dysrhythmia    "my heart skips some beats"  . GERD (gastroesophageal reflux disease)   . H/O hiatal hernia   . Headache(784.0)    pt. believes the recent headaches are related to "brain tumor", states she has a followup appt. wuith Neurologist in Sept.    . Heart murmur   . High cholesterol   . Hypertension   . Memory loss   . Parkinsonism ([place])   . Seizures ([place])    prior to diagnosed /w brain tumor, on lamictal since    . Shortness of breath    /w exertion     Patient Active Problem List   Diagnosis Date Noted  . Left ankle injury, subsequent encounter 01/24/2017  . Restless leg syndrome 06/28/2013  . MRI of brain abnormal  12/26/2012  . Complex partial seizures ([place]) 12/26/2012  . Memory loss 12/26/2012  . Esophageal reflux disease 11/22/2010  . Osteoporosis 06/14/2009  . Depressive disorder, not elsewhere classified 06/14/2009  . Acquired hypothyroidism 06/14/2009    Past Surgical History:  Procedure Laterality Date  . ABDOMINAL HYSTERECTOMY  1990s  . BRAIN BIOPSY  2010   L temple  . BREAST BIOPSY Right 12/12/2012   Procedure: Right breast wire guided excision;  Surgeon: Rolm Bookbinder, MD;  Location: [facility];  Service: General;  Laterality: Right;  . BREAST EXCISIONAL BIOPSY Right    No scars seen   . BUNIONECTOMY     left foot  . EYE SURGERY    . KNEE SURGERY Left 2006     OB History   No obstetric history on file.     Family History  Problem Relation Age of Onset  . Heart disease Mother   . Heart attack Mother   . Cancer Daughter        breast mets to lung  . Breast cancer Daughter   . Diabetes Son   . Colon cancer Father     Social History   Tobacco Use  . Smoking status: Never Smoker  .  Smokeless tobacco: Never Used  Vaping Use  . Vaping Use: Never used  Substance Use Topics  . Alcohol use: No  . Drug use: No    Home Medications Prior to Admission medications   Medication Sig Start Date End Date Taking? Authorizing Provider  alendronate (FOSAMAX) 70 MG tablet Take 70 mg by mouth once a week. Sundays 10/02/17   [provider]  amLODipine (NORVASC) 2.5 MG tablet Take 2.5 mg by mouth daily. 04/25/15   [provider]  lamoTRIgine (LAMICTAL) 100 MG tablet Take 1 tablet (100 mg total) by mouth daily. 12/12/19   Penumalli, Earlean Polka, MD  LORazepam (ATIVAN) 1 MG tablet Take 1 mg by mouth every 8 (eight) hours as needed for sleep (tremors).  Patient not taking: Reported on 12/12/2019    [provider]  metFORMIN (GLUCOPHAGE) 500 MG tablet Take 500 mg by mouth daily. 11/09/19   [provider]  metoprolol succinate (TOPROL-XL) 50 MG 24 hr tablet  Take 50 mg by mouth daily. 04/25/15   [provider]  Naproxen Sodium (ALEVE PO) Take 220 mg by mouth as needed (pain).  Patient not taking: Reported on 12/12/2019    [provider]  omeprazole (PRILOSEC) 20 MG capsule Take 20 mg by mouth daily. 03/03/15   [provider]  oxyCODONE-acetaminophen (PERCOCET/ROXICET) 5-325 MG tablet Take 1 tablet by mouth every 4 (four) hours as needed for severe pain. Patient not taking: Reported on 12/12/2019 05/17/18   Recardo Evangelist, PA-C  predniSONE (DELTASONE) 10 MG tablet Take 2 tablets (20 mg total) by mouth 2 (two) times daily with a meal. Patient not taking: Reported on 12/12/2019 08/20/19   Veryl Speak, MD  rOPINIRole (REQUIP) 1 MG tablet Take 1 tablet (1 mg total) by mouth 2 (two) times daily. 12/12/19   Penumalli, Earlean Polka, MD  traMADol (ULTRAM) 50 MG tablet Take 1 tablet (50 mg total) by mouth every 6 (six) hours as needed. Patient not taking: Reported on 12/12/2019 08/20/19   Veryl Speak, MD  venlafaxine XR (EFFEXOR-XR) 75 MG 24 hr capsule Take 75 mg by mouth daily. 01/08/17   [provider]    Allergies    Patient has no known allergies.  Review of Systems   Review of Systems  Unable to perform ROS: Mental status change  Psychiatric/Behavioral: Positive for confusion.    Physical Exam Updated Vital Signs  ED Triage Vitals  Enc Vitals Group     BP June 12, 2020 1250 111/67     Pulse Rate 06/12/20 1250 (!) 119     Resp 2020-06-12 1250 (!) 37     Temp 2020/06/12 1330 99.5 F (37.5 C)     Temp Source June 12, 2020 1330 Rectal     SpO2 06/12/20 1250 (!) 68 %     Weight --      Height --      Head Circumference --      Peak Flow --      Pain Score 06-12-20 1248 0     Pain Loc --      Pain Edu? --      Excl. in [place]? --     Physical Exam Vitals and nursing note reviewed.  Constitutional:      General: She is in acute distress.     Appearance: She is well-developed and well-nourished. She is ill-appearing.   HENT:     Head: Normocephalic and atraumatic.  Eyes:     Conjunctiva/sclera: Conjunctivae normal.  Pupils: Pupils are equal, round, and reactive to light.  Cardiovascular:     Rate and Rhythm: Regular rhythm. Tachycardia present.     Pulses: Normal pulses.     Heart sounds: No murmur heard.   Pulmonary:     Effort: No respiratory distress.     Comments: Increased work of breathing with coarse breath sounds throughout with poor air movement Abdominal:     Palpations: Abdomen is soft.     Tenderness: There is no abdominal tenderness.  Musculoskeletal:        General: No edema.     Cervical back: Neck supple.  Skin:    General: Skin is warm and dry.     Findings: Bruising present.     Comments: Bruising to the right flank  Neurological:     General: No focal deficit present.     Comments: Moves all extremities, agitated, not able to follow commands opens eyes to pain localizes little bit the pain as well  Psychiatric:        Mood and Affect: Mood and affect normal.     ED Results / Procedures / Treatments   Labs (all labs ordered are listed, but only abnormal results are displayed) Labs Reviewed  CBC WITH DIFFERENTIAL/PLATELET - Abnormal; Notable for the following components:      Result Value   RDW 17.4 (*)    nRBC 0.4 (*)    All other components within normal limits  COMPREHENSIVE METABOLIC PANEL - Abnormal; Notable for the following components:   Sodium 146 (*)    Glucose, Bld 174 (*)    BUN 40 (*)    Creatinine, Ser 1.15 (*)    Calcium 8.6 (*)    Albumin 3.1 (*)    AST 74 (*)    Alkaline Phosphatase 152 (*)    Total Bilirubin 1.8 (*)    GFR, Estimated 45 (*)    Anion gap 16 (*)    All other components within normal limits  D-DIMER, QUANTITATIVE (NOT AT [facility]) - Abnormal; Notable for the following components:   D-Dimer, Quant 3.21 (*)    All other components within normal limits  LACTATE DEHYDROGENASE - Abnormal; Notable for the following components:    LDH 546 (*)    All other components within normal limits  BLOOD GAS, VENOUS - Abnormal; Notable for the following components:   pH, Ven 7.466 (*)    pCO2, Ven 41.1 (*)    pO2, Ven 65.9 (*)    Bicarbonate 29.2 (*)    Acid-Base Excess 5.4 (*)    All other components within normal limits  POC SARS CORONAVIRUS 2 AG -  ED - Abnormal; Notable for the following components:   SARS Coronavirus 2 Ag POSITIVE (*)    All other components within normal limits  SARS CORONAVIRUS 2 BY RT PCR (HOSPITAL ORDER, [facility])  CULTURE, BLOOD (ROUTINE X 2)  CULTURE, BLOOD (ROUTINE X 2)  FIBRINOGEN  LACTIC ACID, PLASMA  LACTIC ACID, PLASMA  PROCALCITONIN  FERRITIN  TRIGLYCERIDES  C-REACTIVE PROTEIN  BRAIN NATRIURETIC PEPTIDE  I-STAT CHEM 8, ED  I-STAT VENOUS BLOOD GAS, ED  I-STAT BETA HCG BLOOD, ED ([facility], [facility], [facility] ONLY)  TROPONIN I (HIGH SENSITIVITY)    EKG None  Radiology DG Chest Portable 1 View  Result Date: 2020/06/13 CLINICAL DATA:  Altered mental status. EXAM: PORTABLE CHEST 1 VIEW COMPARISON:  05/09/2018 FINDINGS: 1311 hours. The cardio pericardial silhouette is enlarged. Vascular congestion with diffuse interstitial  opacity noted bilaterally, progressive in the interval. No pneumothorax or pleural effusion. Bones are diffusely demineralized. Telemetry leads overlie the chest. IMPRESSION: Cardiomegaly with interval progression of diffuse interstitial opacity. Imaging features suggest pulmonary edema or diffuse infection. Electronically Signed   By: Misty Stanley M.D.   On: 04/28/2020 13:29    Procedures .Critical Care Performed by: Lennice Sites, DO Authorized by: Lennice Sites, DO   Critical care provider statement:    Critical care time (minutes):  35   Critical care was necessary to treat or prevent imminent or life-threatening deterioration of the following conditions:  CNS failure or compromise and respiratory failure   Critical care was time spent  personally by me on the following activities:  Blood draw for specimens, discussions with primary provider, development of treatment plan with patient or surrogate, evaluation of patient's response to treatment, examination of patient, obtaining history from patient or surrogate, ordering and performing treatments and interventions, ordering and review of laboratory studies, ordering and review of radiographic studies, pulse oximetry, re-evaluation of patient's condition and review of old charts   I assumed direction of critical care for this patient from another provider in my specialty: no       Medications Ordered in ED Medications  haloperidol lactate (HALDOL) injection 2 mg (has no administration in time range)  dexamethasone (DECADRON) injection 6 mg (has no administration in time range)  remdesivir 200 mg in sodium chloride 0.9% 250 mL IVPB (has no administration in time range)    Followed by  remdesivir 100 mg in sodium chloride 0.9 % 100 mL IVPB (has no administration in time range)  sodium chloride 0.9 % bolus 1,000 mL (1,000 mLs Intravenous New Bag/Given 05/17/2020 1401)  haloperidol lactate (HALDOL) injection 2 mg (2 mg Intramuscular Given 05/07/2020 1401)    ED Course  I have reviewed the triage vital signs and the nursing notes.  Pertinent labs & imaging results that were available during my care of the patient were reviewed by me and considered in my medical decision making (see chart for details).    MDM Rules/Calculators/A&P                          Carrie Short is a 87 year old female history of A. fib, Parkinson's who presents the ED with altered mental status.  Patient hypoxic to the 60s upon arrival and placed on 10 L high flow.  She is altered and not following commands.  She is tachycardic.  Oxygenation improved on 10 L of high flow.  Family at bedside.  Patient is DNR.  She is overall very sick but they do want work-up and possibly antibiotics and treatment.  Will have  ongoing care discussion.  Suspect Covid versus aspiration pneumonia.  She did have a fall last week and has some bruising to the right side of her back rib area.  Could be secondary to rib fractures or rib contusion leading to pneumonia.  Patient not on blood thinners.  She is moving all of her extremities.  Pupils are equal and reactive.  She reacts to pain.  We will get work-up including infectious work-up, empiric antibiotics, head CT.  Covid is positive.  Chest x-ray likely consistent with Covid pneumonia.  No antibiotics.  Otherwise lab work unremarkable.  Inflammatory markers are elevated such as D-dimer and LDH.  Patient had vaccination but not booster.  Overall suspect Covid causing hypoxia and altered mental status.  However blood gas is  overall reassuring.  No severe acidosis or hypercarbia.  Will start IV remdesivir and IV steroids.  Given Haldol for agitation.  Overall goals of care discussion with family at this time are to continue oxygen therapy and some treatments and additional family members are being contacted to discuss if patient should be made comfort care or not.  Anticipate patient will likely have poor outcome.  Will admit to hospitalist at this time.  This chart was dictated using voice recognition software.  Despite best efforts to proofread,  errors can occur which can change the documentation meaning.    Final Clinical Impression(s) / ED Diagnoses Final diagnoses:  Acute respiratory failure with hypoxia [facility])  COVID-19    Rx / DC Orders ED Discharge Orders    None       Lennice Sites, DO 04/21/2020 1434

## 2020-05-15 NOTE — H&P (Signed)
History and Physical        Hospital Admission Note Date: 05/04/2020  Patient name: Carrie Short Medical record number: [id number] Date of birth: Jun 08, 1927 Age: 87 y.o. Gender: female  PCP: Lorene Dy, MD  Patient coming from: home Lives with: son   Chief Complaint    Chief Complaint  Patient presents with  . Altered Mental Status      HPI:   This is a 87 year old female who has been vaccinated with COVID-19 but has not received her booster with past medical history of hypertension, hyperlipidemia, parkinsonism, hypothyroidism, benign brain tumor and anxiety who lives at home with family presented to the ED with confusion and decreased p.o. intake for several days.  Additionally had a fall last week, not on blood thinners, with subsequent bruising on her back not on blood thinners.  ED Course: Afebrile, tachycardic, tachypneic,  hypoxic (SpO2 68% on room air) placed on 10 L/min. Notable Labs: Sodium 146, K3.5, glucose 174, BUN 40, creatinine 1.15, AG 16, alk phos 152, AST 74, ALT 27, T bili 1.8, LDH 546, lactic acid 4.7, procalcitonin 0.23, WBC 8.2, D-dimer 3.2, fibrinogen 3.6, COVID-19 positive. Notable Imaging: CXR-cardiomegaly with interval progression of diffuse interstitial opacity suggesting pulmonary edema or diffuse infection.  Patient was agitated in the ED and was given a dose of Haldol and she also received Decadron, 1 L NS bolus and remdesivir.   I was paged by the bedside nurse at roughly 1500 for acute change in respiratory status and heart rate.  Patient was noted to have SPO2 in the 70s with tachypnea and tachycardia to 170s on telemetry.  At the bedside, patient was clearly in distress and very agitated and incoherent.  Bedside nurse and I set the patient up with improved SPO2 to 90s.  RT arrived at bedside.  Patient was given Lopressor 5 mg IV x1 with  improvement of HR from 170s to 130s.  Cardiomegaly noted on CXR and recently received 1 L NS bolus.  Lasix 20 mg IV x1 given.  Goals of care discussion at the bedside with patient's daughter and son who is POA over the phone.  Plan was made to change to comfort measures only  Vitals:   04/22/2020 1522 04/27/2020 1656  BP:  116/77  Pulse: (!) 165 (!) 152  Resp: (!) 40 (!) 27  Temp:    SpO2: 95% 96%     Review of Systems:  Review of Systems  Unable to perform ROS: Acuity of condition    Medical/Social/Family History   Past Medical History: Past Medical History:  Diagnosis Date  . Anxiety   . Arthritis    L arm, hips & knees - recently had injection in R  knee - Dr. Lorene Dy   . Brain tumor (benign) ([place]) 2010   has consulted /w Dr. Tommi Rumps at [facility], AUG 2013- had MRI  . Cataract   . Dysrhythmia    "my heart skips some beats"  . GERD (gastroesophageal reflux disease)   . H/O hiatal hernia   . Headache(784.0)    pt. believes the recent headaches are related to "brain tumor", states she has a followup appt. wuith Neurologist in Sept.    .  Heart murmur   . High cholesterol   . Hypertension   . Memory loss   . Parkinsonism ([place])   . Seizures ([place])    prior to diagnosed /w brain tumor, on lamictal since    . Shortness of breath    /w exertion     Past Surgical History:  Procedure Laterality Date  . ABDOMINAL HYSTERECTOMY  1990s  . BRAIN BIOPSY  2010   L temple  . BREAST BIOPSY Right 12/12/2012   Procedure: Right breast wire guided excision;  Surgeon: Rolm Bookbinder, MD;  Location: [facility];  Service: General;  Laterality: Right;  . BREAST EXCISIONAL BIOPSY Right    No scars seen   . BUNIONECTOMY     left foot  . EYE SURGERY    . KNEE SURGERY Left 2006    Medications: Prior to Admission medications   Medication Sig Start Date End Date Taking? Authorizing Provider  alendronate (FOSAMAX) 70 MG tablet Take 70 mg by mouth once a week. Sundays 10/02/17   [provider]  amLODipine (NORVASC) 2.5 MG tablet Take 2.5 mg by mouth daily. 04/25/15   [provider]  lamoTRIgine (LAMICTAL) 100 MG tablet Take 1 tablet (100 mg total) by mouth daily. 12/12/19   Penumalli, Earlean Polka, MD  LORazepam (ATIVAN) 1 MG tablet Take 1 mg by mouth every 8 (eight) hours as needed for sleep (tremors).  Patient not taking: Reported on 12/12/2019    [provider]  metFORMIN (GLUCOPHAGE) 500 MG tablet Take 500 mg by mouth daily. 11/09/19   [provider]  metoprolol succinate (TOPROL-XL) 50 MG 24 hr tablet Take 50 mg by mouth daily. 04/25/15   [provider]  Naproxen Sodium (ALEVE PO) Take 220 mg by mouth as needed (pain).  Patient not taking: Reported on 12/12/2019    [provider]  omeprazole (PRILOSEC) 20 MG capsule Take 20 mg by mouth daily. 03/03/15   [provider]  oxyCODONE-acetaminophen (PERCOCET/ROXICET) 5-325 MG tablet Take 1 tablet by mouth every 4 (four) hours as needed for severe pain. Patient not taking: Reported on 12/12/2019 05/17/18   Recardo Evangelist, PA-C  predniSONE (DELTASONE) 10 MG tablet Take 2 tablets (20 mg total) by mouth 2 (two) times daily with a meal. Patient not taking: Reported on 12/12/2019 08/20/19   Veryl Speak, MD  rOPINIRole (REQUIP) 1 MG tablet Take 1 tablet (1 mg total) by mouth 2 (two) times daily. 12/12/19   Penumalli, Earlean Polka, MD  traMADol (ULTRAM) 50 MG tablet Take 1 tablet (50 mg total) by mouth every 6 (six) hours as needed. Patient not taking: Reported on 12/12/2019 08/20/19   Veryl Speak, MD  venlafaxine XR (EFFEXOR-XR) 75 MG 24 hr capsule Take 75 mg by mouth daily. 01/08/17   [provider]    Allergies:  No Known Allergies  Social History:  reports that she has never smoked. She has never used smokeless tobacco. She reports that she does not drink alcohol and does not use drugs.  Family History: Family History  Problem Relation Age of Onset  . Heart disease  Mother   . Heart attack Mother   . Cancer Daughter        breast mets to lung  . Breast cancer Daughter   . Diabetes Son   . Colon cancer Father      Objective   Physical Exam: Blood pressure 116/77, pulse (!) 152, temperature 99.5 F (37.5 C), temperature source Rectal, resp. rate Marland Kitchen)  27, SpO2 96 %.  Physical Exam Vitals and nursing note reviewed. Exam conducted with a chaperone present.  Constitutional:      General: She is in acute distress.     Appearance: She is ill-appearing.  HENT:     Head: Normocephalic.  Eyes:     General:        Right eye: No discharge.        Left eye: No discharge.     Pupils: Pupils are equal, round, and reactive to light.  Cardiovascular:     Rate and Rhythm: Tachycardia present. Rhythm irregular.  Pulmonary:     Effort: Respiratory distress present.     Breath sounds: No wheezing.  Abdominal:     General: Abdomen is flat. There is no distension.  Musculoskeletal:        General: No swelling or deformity.  Skin:    General: Skin is warm.     Findings: No bruising.  Neurological:     Mental Status: She is disoriented.     LABS on Admission: I have personally reviewed all the labs and imaging below    Basic Metabolic Panel: Recent Labs  Lab 05/12/2020 1348  NA 146*  K 3.5  CL 101  CO2 29  GLUCOSE 174*  BUN 40*  CREATININE 1.15*  CALCIUM 8.6*   Liver Function Tests: Recent Labs  Lab 05/13/2020 1348  AST 74*  ALT 27  ALKPHOS 152*  BILITOT 1.8*  PROT 6.7  ALBUMIN 3.1*   No results for input(s): LIPASE, AMYLASE in the last 168 hours. No results for input(s): AMMONIA in the last 168 hours. CBC: Recent Labs  Lab 05/03/2020 1348  WBC 8.2  NEUTROABS 6.6  HGB 12.6  HCT 40.7  MCV 93.1  PLT 151   Cardiac Enzymes: No results for input(s): CKTOTAL, CKMB, CKMBINDEX, TROPONINI in the last 168 hours. BNP: Invalid input(s): POCBNP CBG: No results for input(s): GLUCAP in the last 168 hours.  Radiological Exams on  Admission:  DG Chest Portable 1 View  Result Date: 04/22/2020 CLINICAL DATA:  Altered mental status. EXAM: PORTABLE CHEST 1 VIEW COMPARISON:  05/09/2018 FINDINGS: 1311 hours. The cardio pericardial silhouette is enlarged. Vascular congestion with diffuse interstitial opacity noted bilaterally, progressive in the interval. No pneumothorax or pleural effusion. Bones are diffusely demineralized. Telemetry leads overlie the chest. IMPRESSION: Cardiomegaly with interval progression of diffuse interstitial opacity. Imaging features suggest pulmonary edema or diffuse infection. Electronically Signed   By: Misty Stanley M.D.   On: 04/25/2020 13:29      EKG: Not obtained in the ED   A & P   Active Problems:   Acute hypoxemic respiratory failure due to COVID-19 [facility])   Goals of care, counseling/discussion   Comfort measures only status   Severe sepsis ([place])   Acute metabolic encephalopathy   1. Severe sepsis without septic shock  Acute hypoxic respiratory failure  Acute toxic metabolic encephalopathy secondary to COVID-19 a. Sepsis criteria: Tachycardia, tachypnea, Lactic acidosis, hypoxia b. Comfort measures only c. Palliative care consulted to assist family  2. Hypernatremia from poor p.o. intake  3. Elevated creatinine a. Creatinine 1.15, previously 0.76 in 2019  4. Suspected acute CHF, unknown type a. Cardiomegaly, hypoxia worsened after receiving IV fluids and elevated BNP 900+ b. Received Lasix in the ED c. Comfort measures only  5. Comfort measures only status a. GOC discussion at bedside today with patient's daughter and son over the phone who is POA b. Start morphine drip with  Ativan as needed for anxiety and glycopyrrolate as needed for secretions.  Hold Haldol as this may have worsened her delirium c. Palliative care consulted  6. Hypertension a. N.p.o.  7. Hyperlipidemia    DVT prophylaxis: None   Code Status: DNR  Diet: N.p.o. Family Communication: Admission,  patients condition and plan of care including tests being ordered have been discussed with the patient who indicates understanding and agrees with the plan and Code Status. Patient's family was updated  Disposition Plan: The appropriate patient status for this patient is INPATIENT. Inpatient status is judged to be reasonable and necessary in order to provide the required intensity of service to ensure the patient's safety. The patient's presenting symptoms, physical exam findings, and initial radiographic and laboratory data in the context of their chronic comorbidities is felt to place them at high risk for further clinical deterioration. Furthermore, it is not anticipated that the patient will be medically stable for discharge from the hospital within 2 midnights of admission. The following factors support the patient status of inpatient.   " The patient's presenting symptoms include agitation, shortness of breath. " The worrisome physical exam findings include encephalopathic and in respiratory distress, tachycardic  " The initial radiographic and laboratory data are worrisome because of COVID-19 positive, lactic acidosis " The chronic co-morbidities include diabetes.   * I certify that at the point of admission it is my clinical judgment that the patient will require inpatient hospital care spanning beyond 2 midnights from the point of admission due to high intensity of service, high risk for further deterioration and high frequency of surveillance required.*   Status is: Inpatient  Remains inpatient appropriate because:Altered mental status and Inpatient level of care appropriate due to severity of illness   Dispo: The patient is from: Home              Anticipated d/c is to: Hospital death              Anticipated d/c date is: 1 day              Patient currently is not medically stable to d/c.   Difficult to place patient No         The medical decision making on this patient was  of high complexity and the patient is at high risk for clinical deterioration, therefore this is a level 3 admission.  Consultants  . Palliative care  Procedures  . None  Time Spent on Admission: 80 minutes    Harold Hedge, DO [facility]  04/24/2020, 5:27 PM

## 2020-05-15 NOTE — ED Notes (Signed)
Pt placed on high flow 10L.

## 2020-05-15 NOTE — ED Notes (Signed)
NRB placed at 15L. Patient on 15L NRB and 15L HI FLO at this time

## 2020-05-15 NOTE — ED Notes (Addendum)
Respiratory and MD jared segal called and asked to come check on patient due to increased respirations/heart rate, decreased o2

## 2020-05-15 NOTE — ED Notes (Signed)
Kyere, APP notified about patients temperature. Requested medication for 102.87F fever.

## 2020-05-15 NOTE — ED Notes (Signed)
Pt remains restless. Pts respiration rate 35. Daughter remains at bedside.

## 2020-05-15 NOTE — ED Triage Notes (Signed)
Per [facility] pt from home where lives with family. Pt having AMS x 3days per family to EMS but today pt much worse. Pt has Parkinson's for 10 years. Family has cameras in house to keep watch on patient. Added to EMS that she hasnt been sleeping. Pt fell a week ago and has bruising to right flank area. CBG 300. Pt hx afib per ENS pt HR ranged from 100-130s. RR40s, 116/64, O2 had hard time reading but pulse ox showed in 70s pt placed on 4L oxygen via nasal canula.

## 2020-05-16 ENCOUNTER — Other Ambulatory Visit: Payer: Self-pay

## 2020-05-16 DIAGNOSIS — A419 Sepsis, unspecified organism: Secondary | ICD-10-CM

## 2020-05-16 DIAGNOSIS — J9601 Acute respiratory failure with hypoxia: Secondary | ICD-10-CM | POA: Diagnosis not present

## 2020-05-16 DIAGNOSIS — G9341 Metabolic encephalopathy: Secondary | ICD-10-CM

## 2020-05-16 DIAGNOSIS — R6520 Severe sepsis without septic shock: Secondary | ICD-10-CM

## 2020-05-16 DIAGNOSIS — Z515 Encounter for palliative care: Secondary | ICD-10-CM

## 2020-05-16 DIAGNOSIS — U071 COVID-19: Secondary | ICD-10-CM | POA: Diagnosis not present

## 2020-05-16 MED ORDER — MORPHINE BOLUS VIA INFUSION
1.0000 mg | INTRAVENOUS | Status: DC | PRN
  Administered 2020-05-16 (×3): 1 mg via INTRAVENOUS
  Filled 2020-05-16: qty 4

## 2020-05-20 LAB — CULTURE, BLOOD (ROUTINE X 2)
Culture: NO GROWTH
Special Requests: ADEQUATE

## 2020-05-21 NOTE — Progress Notes (Signed)
I was not able to reach patient's daughter or son over the phone. Called several times with no success.

## 2020-05-21 NOTE — Progress Notes (Signed)
Patient arrived from the ED to the unit via stretcher. Patient is unresponsive and on comfort care. Morphine drip being administered. Will continue to monitor the patient.

## 2020-05-21 NOTE — Death Summary Note (Signed)
Death Summary  Carrie Short:[id number] DOB: Nov 30, 1927 DOA: 06/09/2020  PCP: Lorene Dy, MD  Admit date: 06-09-20 Date of Death: 06/11/2020 Time of Death: 16:47 Notification: Lorene Dy, MD notified of death of 11-Jun-2020   History of present illness:  Carrie Short is a 87 y.o. female with a history of hypertension, dyslipidemia, hypothyroidism, parkinsonism, anxiety and benign brain tumor  Carrie Short presented with complaint of altered mentation. Carrie Short did not improve after aggressive medical therapy.   Carrie Short was admitted to the hospital with the working diagnosis acute hypoxic respiratory failure due to SRAS COVID 19 viral infection.  Complicated with shock.   87 year old female with past medical history for hypertension, dyslipidemia, hypothyroidism, parkinsonism, anxiety and benign brain tumor who presented with altered mentation. She had several days of altered mentation at home, a mechanical fall last week.  On her initial physical examination her oximetry was 68% on room air, blood pressure 116/77, heart rate 165, respiratory rate 40, she was in acute distress, ill looking appearing, tachycardic, tachypneic in respiratory distress, increased work of breathing, soft abdomen, no lower extremity edema, she was disoriented.  Sodium 146, potassium 3.5, chloride 101, bicarb 29, glucose 174, BUN 40, creatinine 1.15, venous lactic acid 4.7, white count 8.2, hemoglobin 12.6, hematocrit 40.7, platelets 151. SARS COVID-19 positive.   Chest radiograph with bilateral interstitial infiltrates, right lower lobe, left upper lobe, left lower lobe.  While in the emergency department patient continued to deteriorate, oxygen saturation down to the 70s despite supplemental oxygen, patient tachycardic and tachypneic.  Did not respond to diuresis.  Her daughter was informed about her poor prognosis and her status was changed to comfort care.  Final Diagnoses:  1.    Acute hypoxemic respiratory failure due to SARS COVID 19 viral pneumonia, complicated with septic shock and multiorgan failure. 2. AKI 3. Metabolic encephalopathy  4. HTN 5. Dyslipidemia 6. Parkinsonism 7. Hypothyroid.    The results of significant diagnostics from this hospitalization (including imaging, microbiology, ancillary and laboratory) are listed below for reference.    Significant Diagnostic Studies: DG Chest Portable 1 View  Result Date: 2020-06-09 CLINICAL DATA:  Altered mental status. EXAM: PORTABLE CHEST 1 VIEW COMPARISON:  05/09/2018 FINDINGS: 1311 hours. The cardio pericardial silhouette is enlarged. Vascular congestion with diffuse interstitial opacity noted bilaterally, progressive in the interval. No pneumothorax or pleural effusion. Bones are diffusely demineralized. Telemetry leads overlie the chest. IMPRESSION: Cardiomegaly with interval progression of diffuse interstitial opacity. Imaging features suggest pulmonary edema or diffuse infection. Electronically Signed   By: Misty Stanley M.D.   On: Jun 09, 2020 13:29    Microbiology: Recent Results (from the past 240 hour(s))  Blood Culture (routine x 2)     Status: None (Preliminary result)   Collection Time: 09-Jun-2020  1:48 PM   Specimen: BLOOD  Result Value Ref Range Status   Specimen Description   Final    BLOOD RIGHT ANTECUBITAL Performed at [facility] [address] [place], [place] [address]    Special Requests   Final    BOTTLES DRAWN AEROBIC AND ANAEROBIC Blood Culture adequate volume Performed at [facility] [address] [place], [place] [address]    Culture   Final    NO GROWTH < 24 HOURS Performed at [facility] [address], [place], [place] [address]    Report Status PENDING  Incomplete  SARS Coronavirus 2 by RT PCR (hospital order, performed in [facility]  hospital lab) Nasopharyngeal Nasopharyngeal Swab     Status: Abnormal   Collection Time:  May 19, 2020  2:06 PM   Specimen: Nasopharyngeal Swab  Result Value Ref Range Status   SARS Coronavirus 2 POSITIVE (A) NEGATIVE Final    Comment: RESULT CALLED TO, READ BACK BY AND VERIFIED WITH: FRANKLIN,C. RN @1631  ON 2020/05/19 BY COHEN,K (NOTE) SARS-CoV-2 target nucleic acids are DETECTED  SARS-CoV-2 RNA is generally detectable in upper respiratory specimens  during the acute phase of infection.  Positive results are indicative  of the presence of the identified virus, but do not rule out bacterial infection or co-infection with other pathogens not detected by the test.  Clinical correlation with patient history and  other diagnostic information is necessary to determine patient infection status.  The expected result is negative.  Fact Sheet for Patients:   StrictlyIdeas.no   Fact Sheet for Healthcare Providers:   BankingDealers.co.za    This test is not yet approved or cleared by the [place] FDA and  has been authorized for detection and/or diagnosis of SARS-CoV-2 by FDA under an Emergency Use Authorization (EUA).  This EUA will remain in effect (mean ing this test can be used) for the duration of  the COVID-19 declaration under Section 564(b)(1) of the Act, 21 U.S.C. section 360-bbb-3(b)(1), unless the authorization is terminated or revoked sooner.  Performed at [facility], [place] [address], [place], [place] [address]      Labs: Basic Metabolic Panel: Recent Labs  Lab May 19, 2020 1348  NA 146*  K 3.5  CL 101  CO2 29  GLUCOSE 174*  BUN 40*  CREATININE 1.15*  CALCIUM 8.6*   Liver Function Tests: Recent Labs  Lab 2020/05/19 1348  AST 74*  ALT 27  ALKPHOS 152*  BILITOT 1.8*  PROT 6.7  ALBUMIN 3.1*   No results for input(s): LIPASE, AMYLASE in the last 168 hours. No results for input(s): AMMONIA in the last 168 hours. CBC: Recent Labs  Lab 05-19-2020 1348  WBC 8.2  NEUTROABS 6.6  HGB 12.6   HCT 40.7  MCV 93.1  PLT 151   Cardiac Enzymes: No results for input(s): CKTOTAL, CKMB, CKMBINDEX, TROPONINI in the last 168 hours. D-Dimer Recent Labs    05/19/20 1348  DDIMER 3.21*   BNP: Invalid input(s): POCBNP CBG: No results for input(s): GLUCAP in the last 168 hours. Anemia work up Recent Labs    May 19, 2020 1348  FERRITIN 168   Urinalysis    Component Value Date/Time   COLORURINE YELLOW 10/28/2017 2347   APPEARANCEUR CLEAR 10/28/2017 2347   LABSPEC 1.011 10/28/2017 2347   PHURINE 7.0 10/28/2017 2347   GLUCOSEU NEGATIVE 10/28/2017 2347   HGBUR SMALL (A) 10/28/2017 2347   BILIRUBINUR NEGATIVE 10/28/2017 2347   KETONESUR NEGATIVE 10/28/2017 2347   PROTEINUR NEGATIVE 10/28/2017 2347   UROBILINOGEN 0.2 07/17/2010 2255   NITRITE NEGATIVE 10/28/2017 2347   LEUKOCYTESUR NEGATIVE 10/28/2017 2347   Sepsis Labs Invalid input(s): PROCALCITONIN,  WBC,  LACTICIDVEN     SIGNED:  Tawni Millers, MD  [facility] 05/17/2020, 8:28 AM Pager   If 7PM-7AM, please contact night-coverage [email or web address] Password [email or web address]

## 2020-05-21 NOTE — Progress Notes (Signed)
Upon assessment patient had no respirations, no pulse. Called Charge nurse Harvest Dark, RN to room and she pronounced patient death at 50. AMION page sent to Dr. Cathlean Sauer.

## 2020-05-21 NOTE — Progress Notes (Signed)
Wasted 1 cc of morphine iv premix with charge nurse Harvest Dark, RN.

## 2020-05-21 NOTE — Progress Notes (Signed)
Wasted 99 mL of IV morphine with charge nurse Harvest Dark, RN.

## 2020-05-21 NOTE — Consult Note (Signed)
Consultation Note Date: 05/17/2020   Patient Name: Carrie Short  DOB: 10-14-1927  MRN: [id number]  Age / Sex: 87 y.o., female  PCP: Lorene Dy, MD Referring Physician: Tawni Millers  Reason for Consultation: Establishing goals of care and Terminal Care  HPI/Patient Profile: 87 y.o. female  with past medical history of Parkinson's disease, hyperlipidemia, hypothyroidism, benign brain tumor admitted on 05/03/2020 with altered mental status. Hospital admission for acute hypoxic respiratory failure due to COVID-19, shock. While in the emergency department patient continued to deteriorate, oxygen saturation down to the 70s despite supplemental oxygen, patient tachycardic and tachypneic.  Did not respond to diuresis. Family made decision for comfort measures only. Palliative medicine consultation for terminal care.   Clinical Assessment and Goals of Care: Reviewed medical records, discussed with care team and evaluated patient. She is unresponsive. She appears comfortable on morphine 5mg  infusion. No s/s of pain or distress. She is still on 15L NRB mask.   Spoke with daughter, Carrie Short via telephone.   Updated her on her mother's condition, comfort focused care plan, symptom management medications, and anticipation of hospital death. Unfortunately Carrie Short and her brother are both covid positive and will not be able to visit her as she nears EOL. Reassured Carrie Short that she appears comfortable and nursing staff is taking excellent care of her.   Explained that her mother is still on 15L NRB mask. Medically recommended on discontinuation of mask as this could prolong the process. Reassured Carrie Short that her mother is comfortable on morphine infusion. Carrie Short understands and agrees with discontinuation of NRB mask as she does not wish to prolong this process for her mother.   Answered questions and concerns. Emotional  support provided. Updated RN on this conversation and plan to bolus morphine and then d/c NRB to nasal cannula or room air.    SUMMARY OF RECOMMENDATIONS    Comfort measures only  Discontinue interventions not aimed at comfort.   Symptom management--see below.  Comfort bites/sips. Aspiration precautions.  Continue frequent oral care and repositioning.   Unfortunately son/daughter are covid + and will not be able to visit.   RN to discontinue NRB mask as patient appears comfortable on morphine infusion. Daughter understands and agrees with this plan, as she does not wish to prolong this process.  Anticipate hospital death.   Code Status/Advance Care Planning:  DNR/DNI  Symptom Management:   Continue morphine infusion  RN may bolus morphine via infusion prn breakthrough pain/dyspnea  PRN ativan  PRN robinul  Palliative Prophylaxis:   Aspiration, Delirium Protocol, Frequent Pain Assessment, Oral Care and Turn Reposition  Additional Recommendations (Limitations, Scope, Preferences):  Full Comfort Care  Psycho-social/Spiritual:   Desire for further Chaplaincy support:yes  Additional Recommendations: Caregiving  Support/Resources, Compassionate Wean Education and Education on Hospice  Prognosis:   Poor prognosis: days-hours  Discharge Planning: Anticipate hospital death     Primary Diagnoses: Present on Admission: . Acute hypoxemic respiratory failure due to COVID-19 [facility])   I have reviewed the medical record, interviewed the patient  and family, and examined the patient. The following aspects are pertinent.  Past Medical History:  Diagnosis Date  . Anxiety   . Arthritis    L arm, hips & knees - recently had injection in R  knee - Dr. Lorene Dy   . Brain tumor (benign) ([place]) 2010   has consulted /w Dr. Tommi Rumps at [facility], AUG 2013- had MRI  . Cataract   . Dysrhythmia    "my heart skips some beats"  . GERD (gastroesophageal reflux disease)   . H/O  hiatal hernia   . Headache(784.0)    pt. believes the recent headaches are related to "brain tumor", states she has a followup appt. wuith Neurologist in Sept.    . Heart murmur   . High cholesterol   . Hypertension   . Memory loss   . Parkinsonism ([place])   . Seizures ([place])    prior to diagnosed /w brain tumor, on lamictal since    . Shortness of breath    /w exertion    Social History   Socioeconomic History  . Marital status: Widowed    Spouse name: Not on file  . Number of children: 2  . Years of education: College  . Highest education level: Not on file  Occupational History  . Occupation: Retired    Fish farm manager: RETIRED  Tobacco Use  . Smoking status: Never Smoker  . Smokeless tobacco: Never Used  Vaping Use  . Vaping Use: Never used  Substance and Sexual Activity  . Alcohol use: No  . Drug use: No  . Sexual activity: Not on file  Other Topics Concern  . Not on file  Social History Narrative   12/12/19 Patient lives at home with caregiver and son full time   Caffeine Use: 1/2 cup a day    Social Determinants of Health   Financial Resource Strain: Not on file  Food Insecurity: Not on file  Transportation Needs: Not on file  Physical Activity: Not on file  Stress: Not on file  Social Connections: Not on file   Family History  Problem Relation Age of Onset  . Heart disease Mother   . Heart attack Mother   . Cancer Daughter        breast mets to lung  . Breast cancer Daughter   . Diabetes Son   . Colon cancer Father    Scheduled Meds: Continuous Infusions: . morphine 5 mg/hr (05/21/2020 1845)   PRN Meds:.acetaminophen, glycopyrrolate **OR** glycopyrrolate **OR** glycopyrrolate, LORazepam **OR** LORazepam **OR** LORazepam, morphine Medications Prior to Admission:  Prior to Admission medications   Medication Sig Start Date End Date Taking? Authorizing Provider  amLODipine (NORVASC) 2.5 MG tablet Take 2.5 mg by mouth daily. 04/25/15  Yes [provider]   lamoTRIgine (LAMICTAL) 100 MG tablet Take 1 tablet (100 mg total) by mouth daily. 12/12/19  Yes Penumalli, Earlean Polka, MD  LORazepam (ATIVAN) 0.5 MG tablet Take 0.5 mg by mouth every 8 (eight) hours as needed for anxiety.   Yes [provider]  metFORMIN (GLUCOPHAGE) 500 MG tablet Take 500 mg by mouth daily. 11/09/19  Yes [provider]  metoprolol succinate (TOPROL-XL) 50 MG 24 hr tablet Take 50 mg by mouth daily. 04/25/15  Yes [provider]  omeprazole (PRILOSEC) 20 MG capsule Take 20 mg by mouth daily. 03/03/15  Yes [provider]  rOPINIRole (REQUIP) 1 MG tablet Take 1 tablet (1 mg total) by mouth 2 (two) times daily. Patient taking differently: Take 0.5  mg by mouth 2 (two) times daily. 12/12/19  Yes Penumalli, Earlean Polka, MD  venlafaxine XR (EFFEXOR-XR) 75 MG 24 hr capsule Take 75 mg by mouth daily. 01/08/17  Yes [provider]  oxyCODONE-acetaminophen (PERCOCET/ROXICET) 5-325 MG tablet Take 1 tablet by mouth every 4 (four) hours as needed for severe pain. Patient not taking: No sig reported 05/17/18   Recardo Evangelist, PA-C  predniSONE (DELTASONE) 10 MG tablet Take 2 tablets (20 mg total) by mouth 2 (two) times daily with a meal. Patient not taking: No sig reported 08/20/19   Veryl Speak, MD  traMADol (ULTRAM) 50 MG tablet Take 1 tablet (50 mg total) by mouth every 6 (six) hours as needed. Patient not taking: No sig reported 08/20/19   Veryl Speak, MD   No Known Allergies Review of Systems  Unable to perform ROS: Acuity of condition   Physical Exam no acute distress or discomfort. On morphine gtt. NRB 15L  Vital Signs: BP (!) 63/43 (BP Location: Left Arm)   Pulse 85   Temp 100.1 F (37.8 C) (Oral)   Resp 10   SpO2 100%  Pain Scale: 0-10   Pain Score: Asleep   SpO2: SpO2: 100 % O2 Device:SpO2: 100 % O2 Flow Rate: .O2 Flow Rate (L/min): 15 L/min  IO: Intake/output summary:   Intake/Output Summary (Last 24 hours) at 05/18/2020  0809 Last data filed at 05/01/2020 0500 Gross per 24 hour  Intake 52.97 ml  Output --  Net 52.97 ml    LBM:   Baseline Weight:   Most recent weight:       Palliative Assessment/Data: PPS 10%    Time Total: 30 Greater than 50%  of this time was spent counseling and coordinating care related to the above assessment and plan.  Signed by:  Ihor Dow, DNP, FNP-C Palliative Medicine Team  Phone: [phone] Fax: [phone]   Please contact Palliative Medicine Team phone at [phone] for questions and concerns.  For individual provider: See Shea Evans

## 2020-05-21 NOTE — Progress Notes (Signed)
PROGRESS NOTE    Carrie Short  [id number] DOB: 1927/10/27 DOA: Jun 02, 2020 PCP: Lorene Dy, MD    Brief Narrative:  Carrie Short was admitted to the hospital with the working diagnosis acute hypoxic respiratory failure due to SRAS COVID 19 viral infection.  Complicated with shock.   87 year old female with past medical history for hypertension, dyslipidemia, hypothyroidism, parkinsonism, anxiety and benign brain tumor who presented with altered mentation. She had several days of altered mentation at home, a mechanical fall last week.  On her initial physical examination her oximetry was 68% on room air, blood pressure 116/77, heart rate 165, respiratory rate 40, she was in acute distress, ill looking appearing, tachycardic, tachypneic in respiratory distress, increased work of breathing, soft abdomen, no lower extremity edema, she was disoriented.  Sodium 146, potassium 3.5, chloride 101, bicarb 29, glucose 174, BUN 40, creatinine 1.15, venous lactic acid 4.7, white count 8.2, hemoglobin 12.6, hematocrit 40.7, platelets 151. SARS COVID-19 positive.   Chest radiograph with bilateral interstitial infiltrates, right lower lobe, left upper lobe, left lower lobe.  While in the emergency department patient continued to deteriorate, oxygen saturation down to the 70s despite supplemental oxygen, patient tachycardic and tachypneic.  Did not respond to diuresis.  Her daughter was informed and her status was changed to comfort care.  Assessment & Plan:   Principal Problem:   Comfort measures only status Active Problems:   Acute hypoxemic respiratory failure due to COVID-19 [facility])   Goals of care, counseling/discussion   Severe sepsis (HCC)   Acute metabolic encephalopathy   1. Acute hypoxemic respiratory failure due to SARS COVID 19 viral pneumonia, complicated with septic shock and multiorgan failure.   Currently patient is on comfort measures, continues IV morphine and as needed  lorazepam. She does have multiorgan failure, poor prognosis, appropriate to continue comfort measures, palliative care.  Her morphine drip is running at 5 mg/h to alleviate respiratory distress.  Death is imminent.   Patient continue to be at high risk for imminent death.   Status is: Inpatient  Remains inpatient appropriate because:IV treatments appropriate due to intensity of illness or inability to take PO   Dispo: The patient is from: Home              Anticipated d/c is to: Home              Anticipated d/c date is: 1 day              Patient currently is not medically stable to d/c.   Difficult to place patient No    DVT prophylaxis: Comfort meassures Code Status:    dnr   Family Communication:  No family at the bedside      Subjective: Patient with agonal breathing, not responsive.   Objective: Vitals:   05/13/2020 0934 04/29/2020 1039 05/05/2020 1044 05/04/2020 1400  BP:    (!) 61/47  Pulse:  (!) 102  (!) 167  Resp:  16  14  Temp:    99.9 F (37.7 C)  TempSrc:    Oral  SpO2: 100% 100% (!) 89% (!) 75%    Intake/Output Summary (Last 24 hours) at 05/12/2020 1406 Last data filed at 05/15/2020 1234 Gross per 24 hour  Intake 52.97 ml  Output -  Net 52.97 ml   There were no vitals filed for this visit.  Examination:   General: deconditioned and ill looking appearing  Neurology: not responsive  E ENT: positive pallor, no icterus, oral mucosadry  Cardiovascular: No JVD. S1-S2 present, rhythmic, no gallops, rubs, or murmurs. No lower extremity edema. Pulmonary: positive breath sounds bilaterally, decreased air movement,  Gastrointestinal. Abdomen soft and non tender Skin. No rashes Musculoskeletal: no joint deformities     Data Reviewed: I have personally reviewed following labs and imaging studies  CBC: Recent Labs  Lab 05/03/2020 1348  WBC 8.2  NEUTROABS 6.6  HGB 12.6  HCT 40.7  MCV 93.1  PLT 270   Basic Metabolic Panel: Recent Labs  Lab  05/14/2020 1348  NA 146*  K 3.5  CL 101  CO2 29  GLUCOSE 174*  BUN 40*  CREATININE 1.15*  CALCIUM 8.6*   GFR: CrCl cannot be calculated (Unknown ideal weight.). Liver Function Tests: Recent Labs  Lab 04/29/2020 1348  AST 74*  ALT 27  ALKPHOS 152*  BILITOT 1.8*  PROT 6.7  ALBUMIN 3.1*   No results for input(s): LIPASE, AMYLASE in the last 168 hours. No results for input(s): AMMONIA in the last 168 hours. Coagulation Profile: No results for input(s): INR, PROTIME in the last 168 hours. Cardiac Enzymes: No results for input(s): CKTOTAL, CKMB, CKMBINDEX, TROPONINI in the last 168 hours. BNP (last 3 results) No results for input(s): PROBNP in the last 8760 hours. HbA1C: No results for input(s): HGBA1C in the last 72 hours. CBG: No results for input(s): GLUCAP in the last 168 hours. Lipid Profile: Recent Labs    04/30/2020 1345  TRIG 120   Thyroid Function Tests: No results for input(s): TSH, T4TOTAL, FREET4, T3FREE, THYROIDAB in the last 72 hours. Anemia Panel: Recent Labs    05/18/2020 1348  FERRITIN 168      Radiology Studies: I have reviewed all of the imaging during this hospital visit personally     Scheduled Meds: Continuous Infusions: . morphine 5 mg/hr (05/10/2020 1845)     LOS: 1 day        Mauricio Gerome Apley, MD

## 2020-05-21 DEATH — deceased

## 2020-05-27 ENCOUNTER — Encounter (INDEPENDENT_AMBULATORY_CARE_PROVIDER_SITE_OTHER): Payer: Medicare Other | Admitting: Ophthalmology

## 2020-12-12 ENCOUNTER — Ambulatory Visit: Payer: Medicare Other | Admitting: Family Medicine

## 2021-01-31 IMAGING — CT CT HEAD W/O CM
3 of 7 series · 14 of 47 positions shown, 16 images · non-contrast
Comparison: Head CT 08/30/2019.  Cervical spine CT 10/28/2017

CLINICAL DATA: Fall with head trauma

EXAM:
CT HEAD WITHOUT CONTRAST
CT CERVICAL SPINE WITHOUT CONTRAST
TECHNIQUE: Multidetector CT imaging of the head and cervical spine was
performed following the standard protocol without intravenous
contrast. Multiplanar CT image reconstructions of the cervical spine
were also generated.

[Series 2: head 5.0 h30s · axial · 0.43mm/px · z∈[-148,-33]mm · 8 of 31 slices shown, 10 images]
[im 4/31  brain]
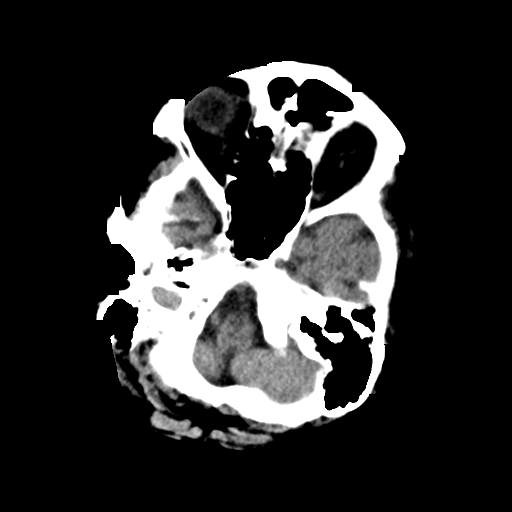
[im 4/31  bone]
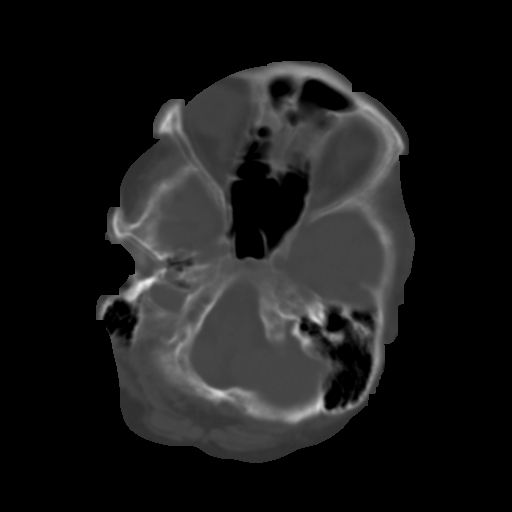
[im 7/31  brain]
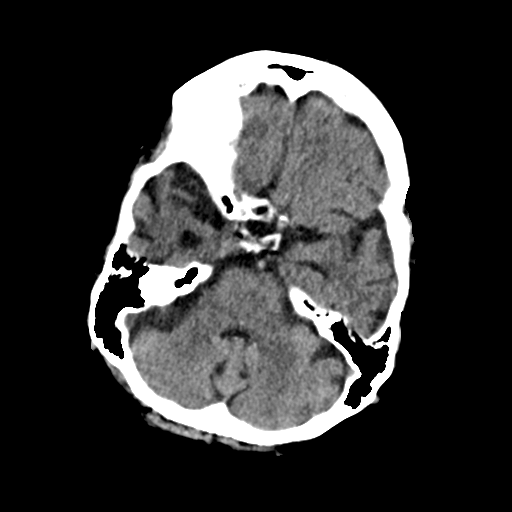
[im 11/31  brain]
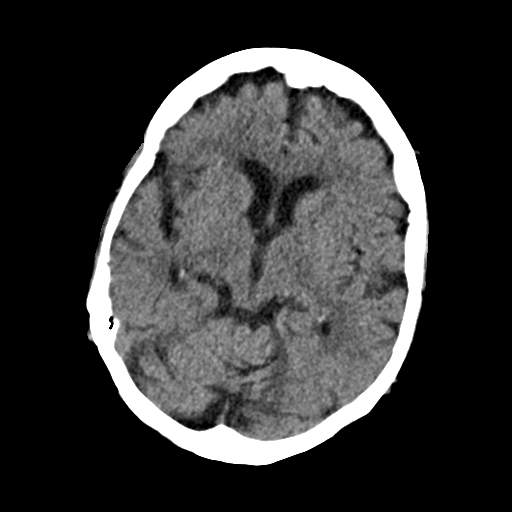
[im 14/31  brain]
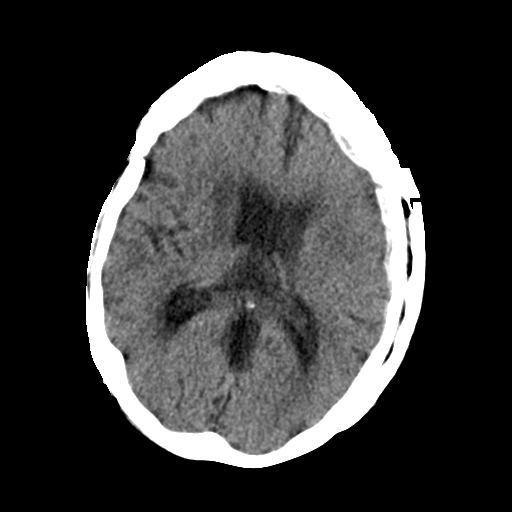
[im 17/31  brain]
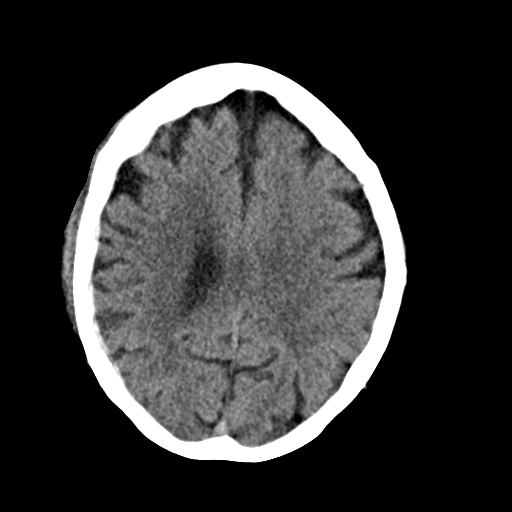
[im 17/31  bone]
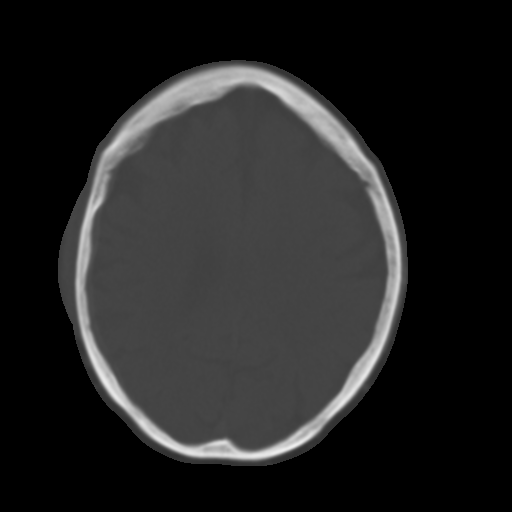
[im 21/31  brain]
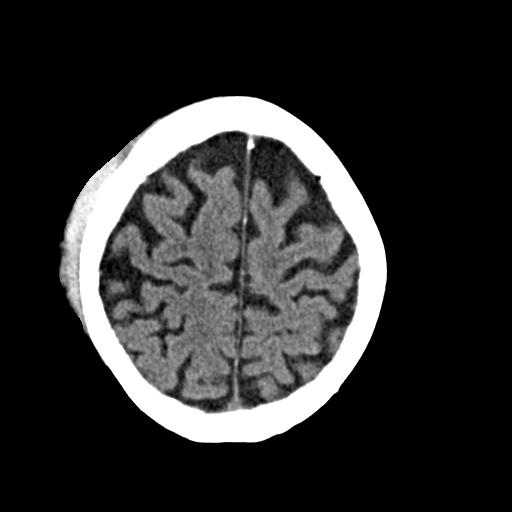
[im 24/31  brain]
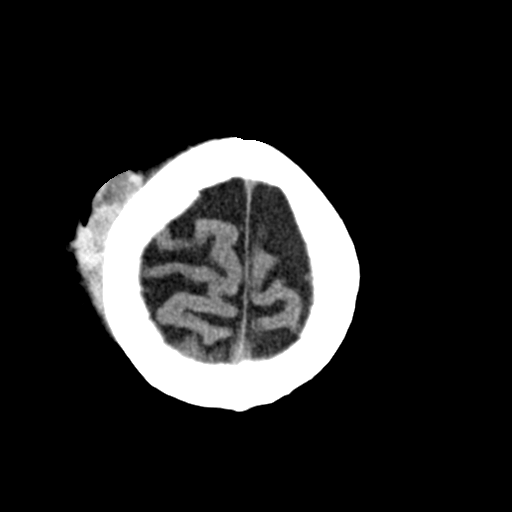
[im 27/31  brain]
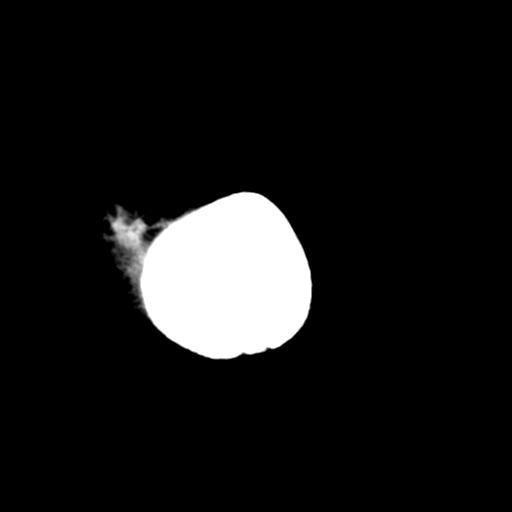

[Series 4: head 3.0 mpr cor · coronal · 0.30mm/px · 3 of 64 slices shown]
[im 16/64  brain]
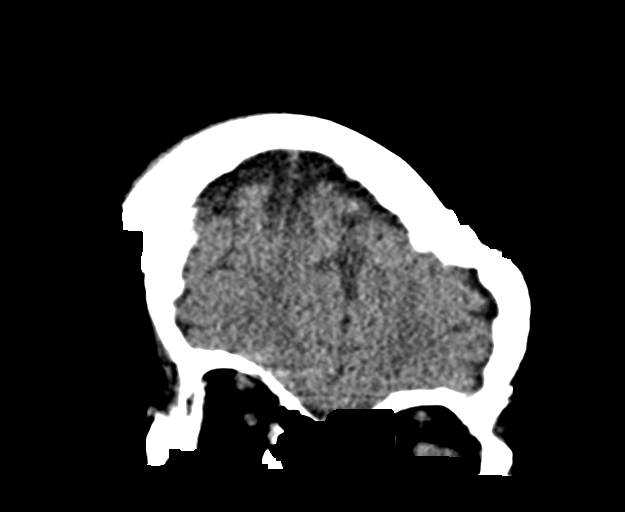
[im 32/64  brain]
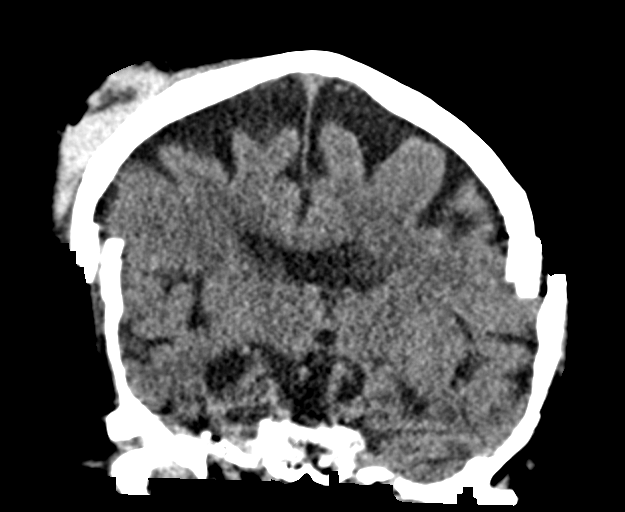
[im 48/64  brain]
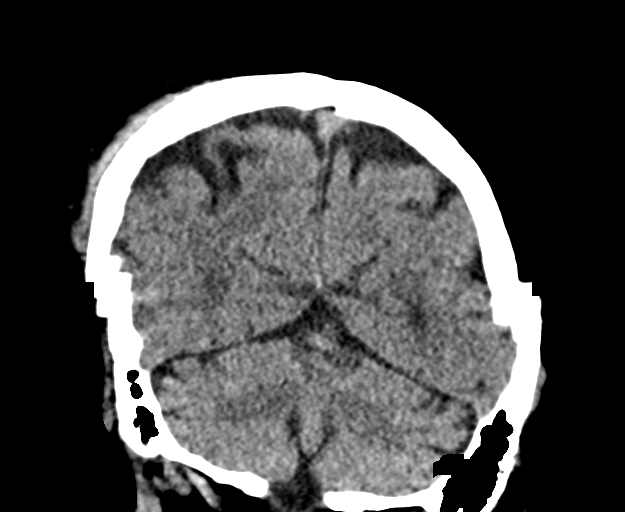

[Series 5: head 3.0 mpr sag · sagittal · 0.32mm/px · 3 of 56 slices shown]
[im 16/56  brain]
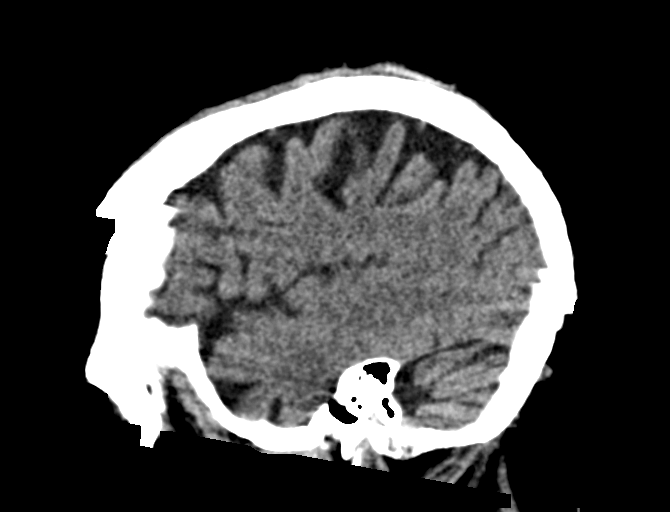
[im 31/56  brain]
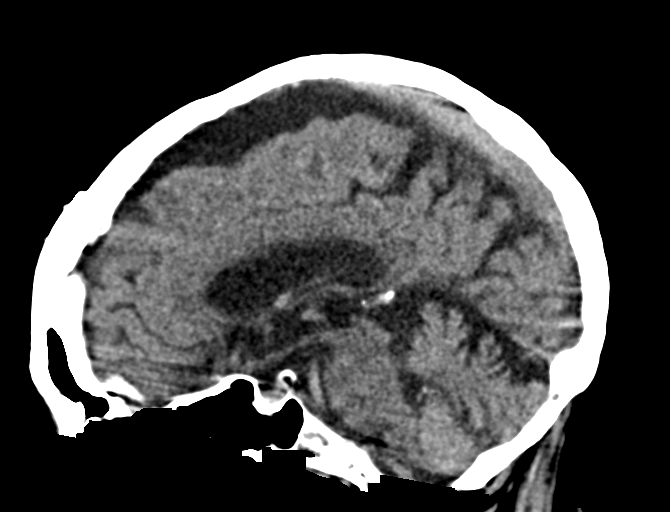
[im 46/56  brain]
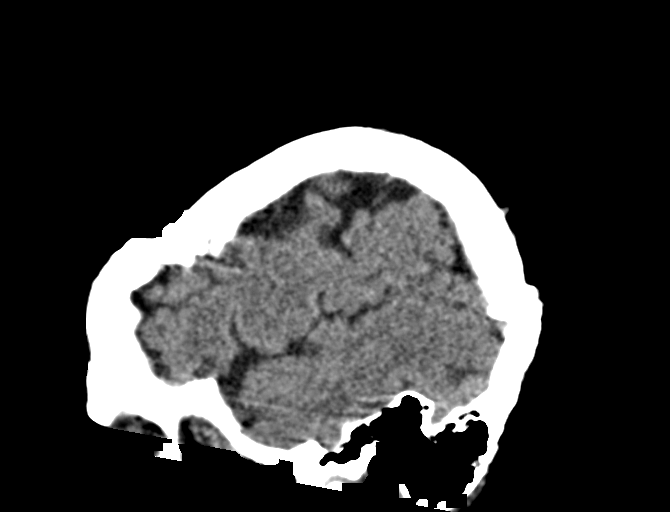

[14 of 47 positions shown; findings below may reference images not displayed]

FINDINGS: CT HEAD FINDINGS

Brain: No evidence of acute infarction, hemorrhage, hydrocephalus,
extra-axial collection or mass lesion/mass effect. Overall mild
cortical volume loss for age, more notable in the right temporal
lobe.

Vascular: No hyperdense vessel or unexpected calcification.

Skull: Right scalp hematoma without calvarial fracture

Sinuses/Orbits: No evidence of injury

Other: Initially motion degraded, requiring 2 acquisitions.

CT CERVICAL SPINE FINDINGS

Alignment: No traumatic malalignment. Degenerative reversal of
cervical lordosis

Skull base and vertebrae: No acute fracture. A small lucency at the
posterosuperior corner of C3 was also present in 4537. T1
hemangioma.

Soft tissues and spinal canal: No prevertebral fluid or swelling. No
visible canal hematoma.

Disc levels: Generalized disc and facet degeneration in keeping with
age.

Upper chest: Negative
IMPRESSION: 1. No evidence of acute intracranial or cervical spine injury.
2. Right scalp hematoma without acute fracture.
